# Patient Record
Sex: Female | Born: 1940 | Race: White | Hispanic: No
Health system: Southern US, Community
[De-identification: ages and names within clinical notes are randomized; demographics above are authoritative.]

---

## 2013-06-18 ENCOUNTER — Inpatient Hospital Stay
Admission: AD | Admit: 2013-06-18 | Discharge: 2013-07-29 | Disposition: A | Discharge status: Skilled Nursing Facility | Payer: Medicare Other | Source: Ambulatory Visit | Attending: Internal Medicine | Admitting: Internal Medicine

## 2013-06-18 ENCOUNTER — Other Ambulatory Visit (HOSPITAL_COMMUNITY): Payer: Self-pay

## 2013-06-18 DIAGNOSIS — Z930 Tracheostomy status: Secondary | ICD-10-CM

## 2013-06-18 DIAGNOSIS — J90 Pleural effusion, not elsewhere classified: Secondary | ICD-10-CM

## 2013-06-18 DIAGNOSIS — J189 Pneumonia, unspecified organism: Secondary | ICD-10-CM

## 2013-06-18 DIAGNOSIS — J9600 Acute respiratory failure, unspecified whether with hypoxia or hypercapnia: Secondary | ICD-10-CM

## 2013-06-18 LAB — BLOOD GAS, ARTERIAL
Acid-Base Excess: 1.6 mmol/L (ref 0.0–2.0)
Bicarbonate: 26.2 mEq/L — ABNORMAL HIGH (ref 20.0–24.0)
DRAWN BY: 3127115
FIO2: 0.5 %
O2 Saturation: 99.7 %
PCO2 ART: 45.5 mmHg — AB (ref 35.0–45.0)
PEEP: 5 cmH2O
PH ART: 7.378 (ref 7.350–7.450)
PO2 ART: 137 mmHg — AB (ref 80.0–100.0)
Patient temperature: 98.6
RATE: 12 resp/min
TCO2: 27.6 mmol/L (ref 0–100)
VT: 500 mL

## 2013-06-18 LAB — TROPONIN I

## 2013-06-18 LAB — CBC
HEMATOCRIT: 28.9 % — AB (ref 36.0–46.0)
HEMOGLOBIN: 9.4 g/dL — AB (ref 12.0–15.0)
MCH: 28 pg (ref 26.0–34.0)
MCHC: 32.5 g/dL (ref 30.0–36.0)
MCV: 86 fL (ref 78.0–100.0)
Platelets: 391 10*3/uL (ref 150–400)
RBC: 3.36 MIL/uL — ABNORMAL LOW (ref 3.87–5.11)
RDW: 14.3 % (ref 11.5–15.5)
WBC: 13.6 10*3/uL — AB (ref 4.0–10.5)

## 2013-06-18 LAB — BASIC METABOLIC PANEL
BUN: 25 mg/dL — ABNORMAL HIGH (ref 6–23)
CHLORIDE: 100 meq/L (ref 96–112)
CO2: 23 mEq/L (ref 19–32)
Calcium: 8.9 mg/dL (ref 8.4–10.5)
Creatinine, Ser: 1.22 mg/dL — ABNORMAL HIGH (ref 0.50–1.10)
GFR calc non Af Amer: 43 mL/min — ABNORMAL LOW (ref >90)
GFR, EST AFRICAN AMERICAN: 50 mL/min — AB (ref >90)
Glucose, Bld: 153 mg/dL — ABNORMAL HIGH (ref 70–99)
Potassium: 3.4 mEq/L — ABNORMAL LOW (ref 3.7–5.3)
SODIUM: 140 meq/L (ref 137–147)

## 2013-06-18 LAB — HEPATIC FUNCTION PANEL
ALBUMIN: 2.7 g/dL — AB (ref 3.5–5.2)
ALK PHOS: 117 U/L (ref 39–117)
ALT: 28 U/L (ref 0–35)
AST: 24 U/L (ref 0–37)
Bilirubin, Direct: <0.2 mg/dL (ref 0.0–0.3)
TOTAL PROTEIN: 6.4 g/dL (ref 6.0–8.3)

## 2013-06-18 LAB — PRO B NATRIURETIC PEPTIDE: Pro B Natriuretic peptide (BNP): 2925 pg/mL — ABNORMAL HIGH (ref 0–125)

## 2013-06-18 LAB — SEDIMENTATION RATE: SED RATE: 100 mm/h — AB (ref 0–22)

## 2013-06-18 LAB — MAGNESIUM: Magnesium: 1.8 mg/dL (ref 1.5–2.5)

## 2013-06-18 LAB — PROCALCITONIN: Procalcitonin: 0.25 ng/mL

## 2013-06-18 LAB — CK TOTAL AND CKMB (NOT AT ARMC): Total CK: 50 U/L (ref 7–177)

## 2013-06-19 LAB — VANCOMYCIN, TROUGH: Vancomycin Tr: 7.8 ug/mL — ABNORMAL LOW (ref 10.0–20.0)

## 2013-06-19 LAB — TSH: TSH: 1.066 u[IU]/mL (ref 0.350–4.500)

## 2013-06-19 LAB — T4, FREE: Free T4: 1.14 ng/dL (ref 0.80–1.80)

## 2013-06-19 LAB — C-REACTIVE PROTEIN: CRP: 2.7 mg/dL — AB (ref <0.60)

## 2013-06-21 ENCOUNTER — Other Ambulatory Visit (HOSPITAL_COMMUNITY): Payer: Self-pay

## 2013-06-21 DIAGNOSIS — J9600 Acute respiratory failure, unspecified whether with hypoxia or hypercapnia: Secondary | ICD-10-CM

## 2013-06-21 DIAGNOSIS — J189 Pneumonia, unspecified organism: Secondary | ICD-10-CM

## 2013-06-21 LAB — BASIC METABOLIC PANEL
BUN: 25 mg/dL — ABNORMAL HIGH (ref 6–23)
CHLORIDE: 101 meq/L (ref 96–112)
CO2: 25 mEq/L (ref 19–32)
Calcium: 9 mg/dL (ref 8.4–10.5)
Creatinine, Ser: 1.07 mg/dL (ref 0.50–1.10)
GFR, EST AFRICAN AMERICAN: 59 mL/min — AB (ref >90)
GFR, EST NON AFRICAN AMERICAN: 51 mL/min — AB (ref >90)
Glucose, Bld: 152 mg/dL — ABNORMAL HIGH (ref 70–99)
POTASSIUM: 4 meq/L (ref 3.7–5.3)
SODIUM: 141 meq/L (ref 137–147)

## 2013-06-21 LAB — BLOOD GAS, ARTERIAL
Acid-Base Excess: 0.4 mmol/L (ref 0.0–2.0)
Bicarbonate: 25.2 mEq/L — ABNORMAL HIGH (ref 20.0–24.0)
FIO2: 0.3 %
O2 SAT: 97.2 %
PEEP: 5 cmH2O
PO2 ART: 96.2 mmHg (ref 80.0–100.0)
Patient temperature: 98.6
RATE: 12 resp/min
TCO2: 26.5 mmol/L (ref 0–100)
VT: 500 mL
pCO2 arterial: 45.3 mmHg — ABNORMAL HIGH (ref 35.0–45.0)
pH, Arterial: 7.363 (ref 7.350–7.450)

## 2013-06-21 LAB — CLOSTRIDIUM DIFFICILE BY PCR: Toxigenic C. Difficile by PCR: NEGATIVE

## 2013-06-21 LAB — CBC
HCT: 28.8 % — ABNORMAL LOW (ref 36.0–46.0)
HEMOGLOBIN: 9.3 g/dL — AB (ref 12.0–15.0)
MCH: 28.2 pg (ref 26.0–34.0)
MCHC: 32.3 g/dL (ref 30.0–36.0)
MCV: 87.3 fL (ref 78.0–100.0)
Platelets: 388 10*3/uL (ref 150–400)
RBC: 3.3 MIL/uL — AB (ref 3.87–5.11)
RDW: 14.3 % (ref 11.5–15.5)
WBC: 12.6 10*3/uL — AB (ref 4.0–10.5)

## 2013-06-21 LAB — PRO B NATRIURETIC PEPTIDE: Pro B Natriuretic peptide (BNP): 3146 pg/mL — ABNORMAL HIGH (ref 0–125)

## 2013-06-21 NOTE — Progress Notes (Signed)
[facility]                                                                                              Progress note     Patient Demographics  Deborah Graves, is a 73 y.o. female  ZOX:[id number]  WJX:[id number]  DOB - 1940/09/11  Admit date - 06/18/2013  Admitting Physician Elnora MorrisonAhmad B Barakat, MD  Outpatient Primary MD for the patient is No primary provider on file.  LOS - 3   Chief complaint   Respiratory failure   Anxiety         Subjective:   Unable to obtain due to sedation, nausea and vomiting he reported that night subcutaneous feed was held  Objective:   Vital signs  Temperature 97.4 Heart rate 85 Respiratory rate 16 Blood pressure 158/78 Pulse ox 97%    Exam Sedated, No new F.N deficits, agitated at times [place].AT,  moist mucous membranes, ET tube in place Supple Neck,No JVD, No cervical lymphadenopathy appriciated.  Symmetrical Chest wall movement, diminished with scattered rhonchi and wheezing RRR,No Gallops,Rubs or new Murmurs, No Parasternal Heave +ve B.Sounds, Abd Soft, Non tender, No organomegaly appriciated, No rebound - guarding or rigidity. No Cyanosis, Clubbing with +1 edema bilaterally   I&Os 1260/2100 ET tube  Data Review   CBC  Recent Labs Lab 06/18/13 1559 06/21/13 0530  WBC 13.6* 12.6*  HGB 9.4* 9.3*  HCT 28.9* 28.8*  PLT 391 388  MCV 86.0 87.3  MCH 28.0 28.2  MCHC 32.5 32.3  RDW 14.3 14.3    Chemistries   Recent Labs Lab 06/18/13 1559 06/21/13 0530  NA 140 141  K 3.4* 4.0  CL 100 101  CO2 23 25  GLUCOSE 153* 152*  BUN 25* 25*  CREATININE 1.22* 1.07  CALCIUM 8.9 9.0  MG 1.8  --   AST 24  --   ALT 28  --   ALKPHOS 117  --   BILITOT <0.2*  --      Cardiac Enzymes  Recent Labs Lab 06/18/13 1601  CKMB <1.0  TROPONINI <0.30    ------------------------------------------------------------------------------------------------------------------ No components found with this basename: POCBNP,   Micro Results Recent Results (from the past 240 hour(s))  CULTURE, RESPIRATORY (NON-EXPECTORATED)     Status: None   Collection Time    06/18/13  5:25 PM      Result Value Ref Range Status   Specimen Description TRACHEAL ASPIRATE   Final   Special Requests Normal   Final   Gram Stain     Final   Value: MODERATE WBC PRESENT,BOTH PMN AND MONONUCLEAR     NO SQUAMOUS EPITHELIAL CELLS SEEN     NO ORGANISMS SEEN     Performed at [facility]   Culture     Final   Value: FEW CANDIDA ALBICANS     Performed at [facility]   Report Status PENDING   Incomplete  CULTURE, BLOOD (ROUTINE X 2)     Status: None   Collection Time    06/18/13  6:05 PM      Result Value Ref Range Status  Specimen Description BLOOD RIGHT HAND   Final   Special Requests BOTTLES DRAWN AEROBIC ONLY 4CC   Final   Culture  Setup Time     Final   Value: 06/18/2013 23:55     Performed at [facility]   Culture     Final   Value:        BLOOD CULTURE RECEIVED NO GROWTH TO DATE CULTURE WILL BE HELD FOR 5 DAYS BEFORE ISSUING A FINAL NEGATIVE REPORT     Performed at [facility]   Report Status PENDING   Incomplete  CULTURE, BLOOD (ROUTINE X 2)     Status: None   Collection Time    06/18/13  6:13 PM      Result Value Ref Range Status   Specimen Description BLOOD LEFT HAND   Final   Special Requests     Final   Value: BOTTLES DRAWN AEROBIC AND ANAEROBIC 10CC AER 8CC ANA   Culture  Setup Time     Final   Value: 06/18/2013 23:55     Performed at [facility]   Culture     Final   Value:        BLOOD CULTURE RECEIVED NO GROWTH TO DATE CULTURE WILL BE HELD FOR 5 DAYS BEFORE ISSUING A FINAL NEGATIVE REPORT     Performed at [facility]   Report Status PENDING   Incomplete       Assessment & Plan     VDRF , ET tube noted still failing weaning trials, PCM consult Line sepsis/SIRS with staph hominis cultured in transferring hospital History of bilateral pneumonia; continue with vancomycin and cefepime Mild non-ST elevation MI at transferring hospital, continue with statin COPD continue with maps Upper GI bleed with gastroccult positive in transferring hospital off aspirin for now Congestive heart failure; mixed systolic and diastolic with ejection fraction 38 percent; continue with diuresis Diabetes mellitus type 2 continue Levemir and insulin sliding scale CVA continue with statin off aspirin due to GI bleed Hypothyroidism normal studies Anxiety disorder on high doses off Versed and fentanyl Noncompliance Protein calorie malnutrition on Glucerna 1.5 per tube, on hold now due to nausea and vomiting  Plan Check KUB today for tracheostomy early this week Discussed with pulmonary critical care Critical care time 38 minutes  Code Status: Full    DVT Prophylaxis SCDs   Carron Curie M.D on 06/21/2013 at 10:25 AM

## 2013-06-21 NOTE — Consult Note (Addendum)
   Name: Deborah CowerMartha Lyssy MRN: [id number] DOB: 1940-03-26    ADMISSION DATE:  06/18/2013 CONSULTATION DATE:  3/30  REFERRING MD :  Sharyon MedicusHijazi  [facility]   CHIEF COMPLAINT:  Vent weaning  BRIEF PATIENT DESCRIPTION:    SIGNIFICANT EVENTS / STUDIES:  ECHO at outside hospital: EF 38%  LINES / TUBES: OETT 3/10>>3/19>>>  CULTURES: Staph hominas 3/10   ANTIBIOTICS:  HISTORY OF PRESENT ILLNESS:   This is a 73 year old female who was [place]/ acute hypercarbic respiratory failure (PCO2 >100), encephalopathy, hyperglycemia and SIRS/sepsis w/ working dx of AECOPD +/- PNA and staph hominas bacteremia. Was intubated, admitted to ICU. Course complicated by NSTEMI, w/ decompensated CM (EF 38%),  presumed HCAP, failed extubation, GIB and concern for CVA due to left sided hemiparesis. [facility].  PAST MEDICAL HISTORY :   HTN DM Medical non-compliance   Allergies not on file  FAMILY HISTORY:  No family history on file. SOCIAL HISTORY:  has no tobacco, alcohol, and drug history on file.  REVIEW OF SYSTEMS:   unable    SUBJECTIVE:  Sedated on vent  VITAL SIGNS:   reviewed   PHYSICAL EXAMINATION: General:  Chronically ill appearing white female, currently sedated on vent  Neuro:  Sedated left side weak HEENT:  Orally intubated  Cardiovascular:  rrr Lungs:  Scattered rhonchi  Abdomen:  Soft, obese, positive bowel sounds  Musculoskeletal:  Generalized edema  Skin:  Scattered area of ecchymosis    Recent Labs Lab 06/18/13 1559 06/21/13 0530  NA 140 141  K 3.4* 4.0  CL 100 101  CO2 23 25  BUN 25* 25*  CREATININE 1.22* 1.07  GLUCOSE 153* 152*    Recent Labs Lab 06/18/13 1559 06/21/13 0530  HGB 9.4* 9.3*  HCT 28.9* 28.8*  WBC 13.6* 12.6*  PLT 391 388   No results found.  ASSESSMENT / PLAN: Acute respiratory failure/ failure to wean (multifactorial due to below):    Decompensated systolic heart failure w/ pulmonary edema  Staph bacteremia and sepsis  HCAP (NOS)  Probable COPD  NSTEMI  Acute encephalopathy   Possible CVA Recommendations: -needs trach -after trach d/c sedating gtts, transition to PRN  -cont current card meds  -continue diuresis as BP/BUN and creatinine allow -continue cefepime: 3/28-->4/4 -cont vanc 3/29-->4/5 -f/u repeat blood cultures. If this is truly bacteremia might need to extend abx. As this was present on admission labs not clear if this is contaminate or actual pathogen. Might consider one time ID input.  -family would not want prolonged care. If after 3-4 weeks of weaning after trach, if no progress would want more palliative approach.   Additional issues: DM, hypothyroidism, heme positive stools Plan Defer to primary team.   CC time 40 min.  Patient seen and examined, agree with above note.  I dictated the care and orders written for this patient under my direction.  Alyson ReedyWesam G Yacoub, MD [phone]

## 2013-06-22 ENCOUNTER — Other Ambulatory Visit (HOSPITAL_COMMUNITY): Payer: Self-pay

## 2013-06-22 ENCOUNTER — Encounter (HOSPITAL_COMMUNITY): Payer: Medicare Other

## 2013-06-22 LAB — BASIC METABOLIC PANEL
BUN: 26 mg/dL — AB (ref 6–23)
CHLORIDE: 103 meq/L (ref 96–112)
CO2: 25 mEq/L (ref 19–32)
Calcium: 8.8 mg/dL (ref 8.4–10.5)
Creatinine, Ser: 1.5 mg/dL — ABNORMAL HIGH (ref 0.50–1.10)
GFR calc Af Amer: 39 mL/min — ABNORMAL LOW (ref >90)
GFR, EST NON AFRICAN AMERICAN: 34 mL/min — AB (ref >90)
GLUCOSE: 169 mg/dL — AB (ref 70–99)
Potassium: 3.7 mEq/L (ref 3.7–5.3)
Sodium: 143 mEq/L (ref 137–147)

## 2013-06-22 LAB — CBC
HEMATOCRIT: 26.6 % — AB (ref 36.0–46.0)
HEMOGLOBIN: 8.4 g/dL — AB (ref 12.0–15.0)
MCH: 27.8 pg (ref 26.0–34.0)
MCHC: 31.6 g/dL (ref 30.0–36.0)
MCV: 88.1 fL (ref 78.0–100.0)
Platelets: 372 10*3/uL (ref 150–400)
RBC: 3.02 MIL/uL — ABNORMAL LOW (ref 3.87–5.11)
RDW: 14.4 % (ref 11.5–15.5)
WBC: 11 10*3/uL — ABNORMAL HIGH (ref 4.0–10.5)

## 2013-06-22 LAB — CULTURE, RESPIRATORY W GRAM STAIN

## 2013-06-22 LAB — CULTURE, RESPIRATORY: Special Requests: NORMAL

## 2013-06-22 NOTE — Progress Notes (Addendum)
[facility]                                                                                              Progress note     Patient Demographics  Deborah Graves, is a 73 y.o. female  ZOX:[id number]  WJX:[id number]  DOB - 22-Mar-1941  Admit date - 06/18/2013  Admitting Physician Elnora Morrison, MD  Outpatient Primary MD for the patient is No primary provider on file.  LOS - 4   Chief complaint   Respiratory failure   Anxiety         Subjective:   Unable to obtain due to sedation, nausea and vomiting he reported that night subcutaneous feed was held  Objective:   Vital signs  Temperature 98.7 Heart rate 62 Respiratory rate 12 Blood pressure 97/76 Pulse ox 97%    Exam Sedated, No new F.N deficits, agitated at times [place].AT,  moist mucous membranes, ET tube in place Supple Neck,No JVD, No cervical lymphadenopathy appriciated.  Symmetrical Chest wall movement, diminished with scattered rhonchi and wheezing RRR,No Gallops,Rubs or new Murmurs, No Parasternal Heave +ve B.Sounds, Abd Soft, Non tender, No organomegaly appriciated, No rebound - guarding or rigidity. No Cyanosis, Clubbing with +1 edema bilaterally   I&Os 712/100 ET tube  Data Review   CBC  Recent Labs Lab 06/18/13 1559 06/21/13 0530 06/22/13 0500  WBC 13.6* 12.6* 11.0*  HGB 9.4* 9.3* 8.4*  HCT 28.9* 28.8* 26.6*  PLT 391 388 372  MCV 86.0 87.3 88.1  MCH 28.0 28.2 27.8  MCHC 32.5 32.3 31.6  RDW 14.3 14.3 14.4    Chemistries   Recent Labs Lab 06/18/13 1559 06/21/13 0530 06/22/13 0500  NA 140 141 143  K 3.4* 4.0 3.7  CL 100 101 103  CO2 23 25 25   GLUCOSE 153* 152* 169*  BUN 25* 25* 26*  CREATININE 1.22* 1.07 1.50*  CALCIUM 8.9 9.0 8.8  MG 1.8  --   --   AST 24  --   --   ALT 28  --   --   ALKPHOS 117  --   --   BILITOT <0.2*  --   --      Cardiac Enzymes  Recent Labs Lab 06/18/13 1601   CKMB <1.0  TROPONINI <0.30   ------------------------------------------------------------------------------------------------------------------ No components found with this basename: POCBNP,   Micro Results Recent Results (from the past 240 hour(s))  CULTURE, RESPIRATORY (NON-EXPECTORATED)     Status: None   Collection Time    06/18/13  5:25 PM      Result Value Ref Range Status   Specimen Description TRACHEAL ASPIRATE   Final   Special Requests Normal   Final   Gram Stain     Final   Value: MODERATE WBC PRESENT,BOTH PMN AND MONONUCLEAR     NO SQUAMOUS EPITHELIAL CELLS SEEN     NO ORGANISMS SEEN     Performed at [facility]   Culture     Final   Value: FEW CANDIDA ALBICANS     Performed at [facility]   Report Status PENDING  Incomplete  CULTURE, BLOOD (ROUTINE X 2)     Status: None   Collection Time    06/18/13  6:05 PM      Result Value Ref Range Status   Specimen Description BLOOD RIGHT HAND   Final   Special Requests BOTTLES DRAWN AEROBIC ONLY 4CC   Final   Culture  Setup Time     Final   Value: 06/18/2013 23:55     Performed at [facility]   Culture     Final   Value:        BLOOD CULTURE RECEIVED NO GROWTH TO DATE CULTURE WILL BE HELD FOR 5 DAYS BEFORE ISSUING A FINAL NEGATIVE REPORT     Performed at [facility]   Report Status PENDING   Incomplete  CULTURE, BLOOD (ROUTINE X 2)     Status: None   Collection Time    06/18/13  6:13 PM      Result Value Ref Range Status   Specimen Description BLOOD LEFT HAND   Final   Special Requests     Final   Value: BOTTLES DRAWN AEROBIC AND ANAEROBIC 10CC AER 8CC ANA   Culture  Setup Time     Final   Value: 06/18/2013 23:55     Performed at [facility]   Culture     Final   Value:        BLOOD CULTURE RECEIVED NO GROWTH TO DATE CULTURE WILL BE HELD FOR 5 DAYS BEFORE ISSUING A FINAL NEGATIVE REPORT     Performed at [facility]   Report Status PENDING   Incomplete   CLOSTRIDIUM DIFFICILE BY PCR     Status: None   Collection Time    06/21/13 10:38 AM      Result Value Ref Range Status   C difficile by pcr NEGATIVE  NEGATIVE Final       Assessment & Plan    VDRF , ET tube noted still failing weaning trials, today patient looks like fighting the ventilator , take will be done by ENT Line sepsis/SIRS with staph hominis cultured in transferring hospital History of bilateral pneumonia; continue with vancomycin and cefepime Mild non-ST elevation MI at transferring hospital, continue with statin COPD continue with nebulizer treatment Upper GI bleed with gastroccult positive in transferring hospital off aspirin for now Congestive heart failure; mixed systolic and diastolic with ejection fraction 38 percent; continue with diuresis Diabetes mellitus type 2 continue Levemir and insulin sliding scale CVA continue with statin off aspirin due to GI bleed Hypothyroidism normal studies Anxiety disorder on high doses off Versed and fentanyl Noncompliance Protein calorie malnutrition on Glucerna 1.5 per tube, started on low dose again  Plan Change the settings of mechanical ventilation to TV 450 rate 18 FiO2 40% and PEEP of 5 Check labs in a.m. Restart tube feeding slowly Critical-care time 41 minutes  Code Status: Full    DVT Prophylaxis SCDs   Carron CurieHijazi, Jemmie Rhinehart M.D on 06/22/2013 at 11:42 AM

## 2013-06-23 ENCOUNTER — Other Ambulatory Visit (HOSPITAL_COMMUNITY): Payer: Self-pay

## 2013-06-23 NOTE — Progress Notes (Signed)
PULMONARY / CRITICAL CARE MEDICINE   Name: Deborah Graves MRN: [id number] DOB: [birth year]    CONSULTATION DATE:  06/21/2013   CHIEF COMPLAINT:  Acute respiratory failure  BRIEF PATIENT DESCRIPTION:  73 yo female admitted to outside hospital with COPD, pneumonia, sepsis, Staph hominas bacteremia and hypercarbic respiratory failure requiring intubation.  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   SUBJECTIVE: Assessed for tracheostomy - not a candidate for percutaneous procedure due to anatomy  PHYSICAL EXAMINATION: General:  Chronically ill appearing white female, currently sedated on vent  Neuro:  Sedated left side weak HEENT:  Orally intubated  Cardiovascular:  rrr Lungs:  Scattered rhonchi  Abdomen:  Soft, obese, positive bowel sounds  Musculoskeletal:  Generalized edema  Skin:  Scattered area of ecchymosis   LABS:  Recent Labs Lab 06/18/13 1559 06/21/13 0530 06/22/13 0500  NA 140 141 143  K 3.4* 4.0 3.7  CL 100 101 103  CO2 23 25 25   BUN 25* 25* 26*  CREATININE 1.22* 1.07 1.50*  GLUCOSE 153* 152* 169*    Recent Labs Lab 06/18/13 1559 06/21/13 0530 06/22/13 0500  HGB 9.4* 9.3* 8.4*  HCT 28.9* 28.8* 26.6*  WBC 13.6* 12.6* 11.0*  PLT 391 388 372   IMAGING: Dg Chest Port 1 View  06/23/2013   CLINICAL DATA:  Respiratory failure.  EXAM: PORTABLE CHEST - 1 VIEW  COMPARISON:  DG CHEST 1V PORT dated 06/22/2013  FINDINGS: Endotracheal tube and NG tube in stable position. Progressive dense right lower lobe infiltrate with possible right pleural effusion noted. Atelectasis and/or infiltrate left lower lobe with small left pleural effusion cannot be excluded. Cardiomegaly is present. No pneumothorax.  IMPRESSION: 1. Line and tube positions stable. 2. Progressive right lower lobe infiltrate consistent with pneumonia. Right-sided pleural effusion cannot be excluded. 3. Left  lower lobe atelectasis and/or infiltrate also present. 4. Cardiomegaly. A component of congestive heart failure/pulmonary edema may be present.   Electronically Signed   By: Maisie Fushomas  Register   On: 06/23/2013 07:07   Dg Chest Port 1 View  06/22/2013   CLINICAL DATA:  Respiratory failure.  EXAM: PORTABLE CHEST - 1 VIEW  COMPARISON:  DG CHEST 1V PORT dated 06/18/2013  FINDINGS: Endotracheal tube is 2.7 cm above the carina. Nasogastric tube extends into the abdomen. Central line tip in the upper SVC region. Hazy densities at both lung bases suggest atelectasis and pleural effusions. Cannot exclude mild interstitial edema. Heart size is within normal limits. Atherosclerotic calcifications at the aortic arch.  IMPRESSION: Bibasilar densities are compatible with pleural effusions and atelectasis. Probable mild interstitial edema.   Electronically Signed   By: Richarda OverlieAdam  Henn M.D.   On: 06/22/2013 08:00   Dg Abd Portable 1v  06/21/2013   CLINICAL DATA:  Small bowel obstruction.  EXAM: PORTABLE ABDOMEN - 1 VIEW  COMPARISON:  06/18/2013  FINDINGS: There is no bowel dilation. There is no radiographic evidence of a bowel obstruction. A nasogastric tube lies within the mid stomach.  There are surgical vascular clips in the pelvis. Soft tissues are otherwise unremarkable.  IMPRESSION: No evidence of a bowel obstruction.   Electronically Signed   By: Amie Portlandavid  Ormond M.D.   On: 06/21/2013 17:05   ASSESSMENT: Acute respiratory failure Acute on chronic systolic heart failure Pulmonary edema HCAP Suspectd COPD without evidence of exacerbation Possible CVA  Acute encephalopathy / delirium Psychiatric history?  PLAN: Tracheostomy by ENT  D/c Versed gtt after tracheostomy and start Precedex Defer weaning attempts until after tracheostomy and sedation d/c Add Depakote  Bronchodilators Add inhaled steroids No indication for systemic steroids Antibiotics per primary team Aim for negative fluid balance and avoid  tachycardia Family would not want prolonged care, would consider palliation if weaning is unsuccessful   Simonne Martinet, NP [phone]  I have personally obtained history, examined patient, evaluated and interpreted laboratory and imaging results, reviewed medical records, formulated assessment / plan and placed orders.  Lonia Farber, MD Pulmonary and Critical Care Medicine [facility] Pager: [phone]  06/23/2013, 1:14 PM

## 2013-06-23 NOTE — Progress Notes (Signed)
[facility]                                                                                              Progress note     Patient Demographics  Deborah Graves, is a 73 y.o. female  ZOX:[id number]  WJX:[id number]  DOB - 24-Jul-1940  Admit date - 06/18/2013  Admitting Physician Elnora Morrison, MD  Outpatient Primary MD for the patient is No primary provider on file.  LOS - 5   Chief complaint   Respiratory failure   Anxiety         Subjective:   Unable to obtain due to sedation, nausea and vomiting he reported that night subcutaneous feed was held  Objective:   Vital signs  Temperature 97.5 Heart rate 60 Respiratory rate 18 Blood pressure 144/55 Pulse ox 99%    Exam Sedated, No new F.N deficits, agitated at times [place].AT,  moist mucous membranes, ET tube in place Supple Neck,No JVD, No cervical lymphadenopathy appriciated.  Symmetrical Chest wall movement, diminished with scattered rhonchi and wheezing RRR,No Gallops,Rubs or new Murmurs, No Parasternal Heave +ve B.Sounds, Abd Soft, Non tender, No organomegaly appriciated, No rebound - guarding or rigidity. No Cyanosis, Clubbing with +1 edema bilaterally   I&Os 2434/1000 ET tube  Data Review   CBC  Recent Labs Lab 06/18/13 1559 06/21/13 0530 06/22/13 0500  WBC 13.6* 12.6* 11.0*  HGB 9.4* 9.3* 8.4*  HCT 28.9* 28.8* 26.6*  PLT 391 388 372  MCV 86.0 87.3 88.1  MCH 28.0 28.2 27.8  MCHC 32.5 32.3 31.6  RDW 14.3 14.3 14.4    Chemistries   Recent Labs Lab 06/18/13 1559 06/21/13 0530 06/22/13 0500  NA 140 141 143  K 3.4* 4.0 3.7  CL 100 101 103  CO2 23 25 25   GLUCOSE 153* 152* 169*  BUN 25* 25* 26*  CREATININE 1.22* 1.07 1.50*  CALCIUM 8.9 9.0 8.8  MG 1.8  --   --   AST 24  --   --   ALT 28  --   --   ALKPHOS 117  --   --   BILITOT <0.2*  --   --      Cardiac Enzymes  Recent Labs Lab  06/18/13 1601  CKMB <1.0  TROPONINI <0.30   ------------------------------------------------------------------------------------------------------------------ No components found with this basename: POCBNP,   Micro Results Recent Results (from the past 240 hour(s))  CULTURE, RESPIRATORY (NON-EXPECTORATED)     Status: None   Collection Time    06/18/13  5:25 PM      Result Value Ref Range Status   Specimen Description TRACHEAL ASPIRATE   Final   Special Requests Normal   Final   Gram Stain     Final   Value: MODERATE WBC PRESENT,BOTH PMN AND MONONUCLEAR     NO SQUAMOUS EPITHELIAL CELLS SEEN     NO ORGANISMS SEEN     Performed at [facility]   Culture     Final   Value: FEW CANDIDA ALBICANS     Performed at [facility]   Report Status 06/22/2013 FINAL  Final  CULTURE, BLOOD (ROUTINE X 2)     Status: None   Collection Time    06/18/13  6:05 PM      Result Value Ref Range Status   Specimen Description BLOOD RIGHT HAND   Final   Special Requests BOTTLES DRAWN AEROBIC ONLY 4CC   Final   Culture  Setup Time     Final   Value: 06/18/2013 23:55     Performed at [facility]   Culture     Final   Value:        BLOOD CULTURE RECEIVED NO GROWTH TO DATE CULTURE WILL BE HELD FOR 5 DAYS BEFORE ISSUING A FINAL NEGATIVE REPORT     Performed at [facility]   Report Status PENDING   Incomplete  CULTURE, BLOOD (ROUTINE X 2)     Status: None   Collection Time    06/18/13  6:13 PM      Result Value Ref Range Status   Specimen Description BLOOD LEFT HAND   Final   Special Requests     Final   Value: BOTTLES DRAWN AEROBIC AND ANAEROBIC 10CC AER 8CC ANA   Culture  Setup Time     Final   Value: 06/18/2013 23:55     Performed at [facility]   Culture     Final   Value:        BLOOD CULTURE RECEIVED NO GROWTH TO DATE CULTURE WILL BE HELD FOR 5 DAYS BEFORE ISSUING A FINAL NEGATIVE REPORT     Performed at [facility]   Report Status PENDING    Incomplete  CLOSTRIDIUM DIFFICILE BY PCR     Status: None   Collection Time    06/21/13 10:38 AM      Result Value Ref Range Status   C difficile by pcr NEGATIVE  NEGATIVE Final       Assessment & Plan    VDRF , ET tube noted still failing weaning trials,  trach will be done by ENT on Friday a.m. Line sepsis/SIRS with staph hominis cultured in transferring hospital History of bilateral pneumonia; continue with vancomycin and cefepime Mild non-ST elevation MI at transferring hospital, continue with statin COPD continue with nebulizer treatment Upper GI bleed with gastroccult positive in transferring hospital off aspirin for now Congestive heart failure; mixed systolic and diastolic with ejection fraction 38 percent; continue with diuresis and    ncrease Lasix to twice a day Diabetes mellitus type 2 continue Levemir and insulin sliding scale CVA continue with statin off aspirin due to GI bleed Hypothyroidism normal studies Anxiety disorder on high doses off Versed and fentanyl will start weaning and start Depakote or Noncompliance Protein calorie malnutrition on Glucerna 1.5 per tube, started on low dose again  Plan Increase Lasix to 40 mg IV twice a day  hold Lovenox on Thursday  Depakote 250 twice a day Change nebulizer treatments to every 6 hours   Pulmicort twice a day DC IV fluids Discussed with PCCM Critical care time 33 minutes   Code Status: Full    DVT Prophylaxis  Lovenox   Carron CurieHijazi, Fable Huisman M.D on 06/23/2013 at 12:33 PM

## 2013-06-24 ENCOUNTER — Other Ambulatory Visit (HOSPITAL_COMMUNITY): Payer: Self-pay

## 2013-06-24 ENCOUNTER — Encounter (HOSPITAL_COMMUNITY): Payer: Self-pay

## 2013-06-24 LAB — CBC
HEMATOCRIT: 24.8 % — AB (ref 36.0–46.0)
Hemoglobin: 7.7 g/dL — ABNORMAL LOW (ref 12.0–15.0)
MCH: 27.4 pg (ref 26.0–34.0)
MCHC: 31 g/dL (ref 30.0–36.0)
MCV: 88.3 fL (ref 78.0–100.0)
Platelets: 360 10*3/uL (ref 150–400)
RBC: 2.81 MIL/uL — ABNORMAL LOW (ref 3.87–5.11)
RDW: 14.8 % (ref 11.5–15.5)
WBC: 9.6 10*3/uL (ref 4.0–10.5)

## 2013-06-24 LAB — CULTURE, BLOOD (ROUTINE X 2)
CULTURE: NO GROWTH
Culture: NO GROWTH

## 2013-06-24 LAB — BASIC METABOLIC PANEL
BUN: 37 mg/dL — AB (ref 6–23)
CO2: 24 mEq/L (ref 19–32)
CREATININE: 2.82 mg/dL — AB (ref 0.50–1.10)
Calcium: 8.8 mg/dL (ref 8.4–10.5)
Chloride: 102 mEq/L (ref 96–112)
GFR calc Af Amer: 18 mL/min — ABNORMAL LOW (ref >90)
GFR, EST NON AFRICAN AMERICAN: 16 mL/min — AB (ref >90)
Glucose, Bld: 108 mg/dL — ABNORMAL HIGH (ref 70–99)
Potassium: 4.9 mEq/L (ref 3.7–5.3)
Sodium: 139 mEq/L (ref 137–147)

## 2013-06-24 NOTE — Progress Notes (Addendum)
[facility]                                                                                              Progress note     Patient Demographics  Deborah Graves, is a [age] y.o. female  UVO:[id number]  [id number]:[id number]  DOB - 09-Aug-1940  Admit date - 06/18/2013  Admitting Physician Elnora MorrisonAhmad B Barakat, MD  Outpatient Primary MD for the patient is No primary provider on file.  LOS - 6   Chief complaint   Respiratory failure   Anxiety         Subjective:   Unable to obtain due to sedation, nausea and vomiting he reported that night subcutaneous feed was held  Objective:   Vital signs  Temperature  98.5  Heart rate 74  Respiratory rate  17   Blood pressure  99/79 Pulse ox  100%     Exam Sedated, No new F.N deficits, agitated at times [place].AT,  moist mucous membranes, ET tube in place Supple Neck,No JVD, No cervical lymphadenopathy appriciated.  Symmetrical Chest wall movement, diminished with scattered rhonchi and wheezing RRR,No Gallops,Rubs or new Murmurs, No Parasternal Heave +ve B.Sounds, Abd Soft, Non tender, No organomegaly appriciated, No rebound - guarding or rigidity. No Cyanosis, Clubbing with +1 edema bilaterally   I&Os  1766/825  ET tube  Data Review   CBC  Recent Labs Lab 06/18/13 1559 06/21/13 0530 06/22/13 0500 06/24/13 0615  WBC 13.6* 12.6* 11.0* 9.6  HGB 9.4* 9.3* 8.4* 7.7*  HCT 28.9* 28.8* 26.6* 24.8*  PLT 391 388 372 360  MCV 86.0 87.3 88.1 88.3  MCH 28.0 28.2 27.8 27.4  MCHC 32.5 32.3 31.6 31.0  RDW 14.3 14.3 14.4 14.8    Chemistries   Recent Labs Lab 06/18/13 1559 06/21/13 0530 06/22/13 0500 06/24/13 0615  NA 140 141 143 139  K 3.4* 4.0 3.7 4.9  CL 100 101 103 102  CO2 23 25 25 24   GLUCOSE 153* 152* 169* 108*  BUN 25* 25* 26* 37*  CREATININE 1.22* 1.07 1.50* 2.82*  CALCIUM 8.9 9.0 8.8 8.8  MG 1.8  --   --   --   AST 24  --   --   --    ALT 28  --   --   --   ALKPHOS 117  --   --   --   BILITOT <0.2*  --   --   --      Cardiac Enzymes  Recent Labs Lab 06/18/13 1601  CKMB <1.0  TROPONINI <0.30   ------------------------------------------------------------------------------------------------------------------ No components found with this basename: POCBNP,   Micro Results Recent Results (from the past 240 hour(s))  CULTURE, RESPIRATORY (NON-EXPECTORATED)     Status: None   Collection Time    06/18/13  5:25 PM      Result Value Ref Range Status   Specimen Description TRACHEAL ASPIRATE   Final   Special Requests Normal   Final   Gram Stain     Final   Value: MODERATE WBC PRESENT,BOTH PMN AND MONONUCLEAR     NO SQUAMOUS EPITHELIAL CELLS  SEEN     NO ORGANISMS SEEN     Performed at [facility]   Culture     Final   Value: FEW CANDIDA ALBICANS     Performed at [facility]   Report Status 06/22/2013 FINAL   Final  CULTURE, BLOOD (ROUTINE X 2)     Status: None   Collection Time    06/18/13  6:05 PM      Result Value Ref Range Status   Specimen Description BLOOD RIGHT HAND   Final   Special Requests BOTTLES DRAWN AEROBIC ONLY 4CC   Final   Culture  Setup Time     Final   Value: 06/18/2013 23:55     Performed at [facility]   Culture     Final   Value: NO GROWTH 5 DAYS     Performed at [facility]   Report Status 06/24/2013 FINAL   Final  CULTURE, BLOOD (ROUTINE X 2)     Status: None   Collection Time    06/18/13  6:13 PM      Result Value Ref Range Status   Specimen Description BLOOD LEFT HAND   Final   Special Requests     Final   Value: BOTTLES DRAWN AEROBIC AND ANAEROBIC 10CC AER 8CC ANA   Culture  Setup Time     Final   Value: 06/18/2013 23:55     Performed at [facility]   Culture     Final   Value: NO GROWTH 5 DAYS     Performed at [facility]   Report Status 06/24/2013 FINAL   Final  CLOSTRIDIUM DIFFICILE BY PCR     Status: None    Collection Time    06/21/13 10:38 AM      Result Value Ref Range Status   C difficile by pcr NEGATIVE  NEGATIVE Final       Assessment & Plan    VDRF , ET tube noted still failing weaning trials,  trach will be done by ENT on Friday a.m., Keep n.p.o.> midnight Line sepsis/SIRS with staph hominis cultured in transferring hospital History of bilateral pneumonia; continue with vancomycin and cefepime Mild non-ST elevation MI at transferring hospital, continue with statin COPD continue with nebulizer treatment Upper GI bleed with gastroccult positive in transferring hospital off aspirin for now Congestive heart failure; mixed systolic and diastolic with ejection fraction 38 percent; hold diuresis due to renal failure Diabetes mellitus type 2 continue Levemir and insulin sliding scale CVA continue with statin off aspirin due to GI bleed Hypothyroidism normal studies Anxiety disorder on high doses off Versed and fentanyl will start weaning and start Depakote or Noncompliance Protein calorie malnutrition on Glucerna 1.5 per tube, started on low dose again Acute on chronic kidney injury  Plan Hold Lasix today and tomorrow  IV fluids normal saline DC lisinopril Check BMP in a.m. Change DVT prophylaxis to heparin  Critical care time 36 minutes  Code Status: Full  DVT Prophylaxis  heparin   Carron Curie M.D on 06/24/2013 at 12:04 PM

## 2013-06-24 NOTE — Consult Note (Unsigned)
NAMBarbara Graves:  Graves, Deborah             ACCOUNT NO.:  [id number]  MEDICAL RECORD NO.:  [id number]  LOCATION:  RESPT                        FACILITY:  [facility]  PHYSICIAN:  Kristine GarbeChristopher E. Ezzard StandingNewman, M.D.DATE OF BIRTH:  [birth year]  DATE OF CONSULTATION: DATE OF DISCHARGE:                                CONSULTATION   REASON FOR CONSULT:  Evaluate patient for tracheotomy.  BRIEF HISTORY:  Deborah CowerMartha Graves is a 73 year old female with history of poorly controlled diabetes and hypertension.  She was [place] shortness of breath, breath and change in mental status.  She was intubated at that time. Attempted extubation was unsuccessful on BiPAP and she was reintubated last on June 10, 2013.  She had EEG, which showed disturbance of cerebral function consistent with severe encephalopathy.  She had a CT scan, which demonstrated no obvious intracranial abnormalities.  She was subsequently [facility].  She was not felt to be a candidate for bedside tracheotomy and I was subsequently consulted for placement of tracheostomy via the operating room.  She has been chronically intubated, has failed extubation.  PHYSICAL EXAMINATION:  The patient is not mentally responsive.  She is mildly obese.  Neck is little crooked, but no palpable masses along the trach site.  The patient is intubated on ventilator.  IMPRESSION:  Acute-on-chronic respiratory failure.  RECOMMENDATION:  We will plan tracheotomy in the next couple of days. The patient is presently on Lovenox.  We will need to stop the Lovenox prior to trach and also hold the tube feedings.  We will plan tracheotomy on June 25, 2013.          ______________________________ Kristine Garbehristopher E. Ezzard StandingNewman, M.D.     CEN/MEDQ  D:  06/23/2013  T:  06/23/2013  Job:  [id number]

## 2013-06-24 NOTE — H&P (Signed)
PREOPERATIVE H&P  Chief Complaint: respiratory failure  HPI: Barbara CowerMartha Graves is a 73 y.o. female who presents for evaluation of respiratory failure. She initially presented to an outside ER with mental status changes and respiratory difficulty requiring intubation 3 weeks ago. Initial attempt at extubation was unsuccessful requiring reintubation. [facility]. Bedside tracheostomy was not felt to be an option and I was consulted for placement of trach in the OR.  No past medical history on file. No past surgical history on file. History   Social History  . Marital Status: Widowed    Spouse Name: N/A    Number of Children: N/A  . Years of Education: N/A   Social History Main Topics  . Smoking status: Not on file  . Smokeless tobacco: Not on file  . Alcohol Use: Not on file  . Drug Use: Not on file  . Sexual Activity: Not on file   Other Topics Concern  . Not on file   Social History Narrative  . No narrative on file   No family history on file. Allergies not on file Prior to Admission medications   Not on File     Positive ROS: patient not responsive  All other systems have been reviewed and were otherwise negative with the exception of those mentioned in the HPI and as above.  Physical Exam: There were no vitals filed for this visit.  General: Intubated and non responsive Oral: Normal oral mucosa and tonsils Nasal: Clear nasal passages Neck: No palpable adenopathy or thyroid nodules. Trach midline without masses. Ear: Ear canal is clear with normal appearing TMs Cardiovascular: Regular rate and rhythm, no murmur.  Respiratory: Clear to auscultation Neurologic: NA   Assessment/Plan: RESPIRATORY FAILURE Plan for Procedure(s): TRACHEOSTOMY   Dillard CannonNEWMAN, CHRISTOPHER, MD 06/24/2013 12:23 PM

## 2013-06-25 ENCOUNTER — Other Ambulatory Visit (HOSPITAL_COMMUNITY): Payer: Self-pay

## 2013-06-25 ENCOUNTER — Ambulatory Visit (HOSPITAL_COMMUNITY): Admission: RE | Admit: 2013-06-25 | Payer: Medicare Other | Source: Ambulatory Visit | Admitting: Otolaryngology

## 2013-06-25 ENCOUNTER — Encounter (HOSPITAL_COMMUNITY): Payer: Self-pay | Admitting: Anesthesiology

## 2013-06-25 ENCOUNTER — Encounter
Admission: AD | Disposition: A | Discharge status: Skilled Nursing Facility | Payer: Self-pay | Source: Ambulatory Visit | Attending: Internal Medicine

## 2013-06-25 HISTORY — PX: TRACHEOSTOMY TUBE PLACEMENT: SHX814

## 2013-06-25 LAB — URINALYSIS, ROUTINE W REFLEX MICROSCOPIC
BILIRUBIN URINE: NEGATIVE
Glucose, UA: NEGATIVE mg/dL
Ketones, ur: NEGATIVE mg/dL
Leukocytes, UA: NEGATIVE
NITRITE: NEGATIVE
Protein, ur: 30 mg/dL — AB
SPECIFIC GRAVITY, URINE: 1.02 (ref 1.005–1.030)
Urobilinogen, UA: 0.2 mg/dL (ref 0.0–1.0)
pH: 5 (ref 5.0–8.0)

## 2013-06-25 LAB — URINE MICROSCOPIC-ADD ON

## 2013-06-25 LAB — BASIC METABOLIC PANEL
BUN: 45 mg/dL — AB (ref 6–23)
CALCIUM: 8.8 mg/dL (ref 8.4–10.5)
CO2: 24 meq/L (ref 19–32)
CREATININE: 3.16 mg/dL — AB (ref 0.50–1.10)
Chloride: 103 mEq/L (ref 96–112)
GFR calc Af Amer: 16 mL/min — ABNORMAL LOW (ref >90)
GFR, EST NON AFRICAN AMERICAN: 14 mL/min — AB (ref >90)
GLUCOSE: 118 mg/dL — AB (ref 70–99)
Potassium: 5.5 mEq/L — ABNORMAL HIGH (ref 3.7–5.3)
Sodium: 140 mEq/L (ref 137–147)

## 2013-06-25 LAB — VANCOMYCIN, RANDOM: VANCOMYCIN RM: 41.9 ug/mL

## 2013-06-25 SURGERY — CREATION, TRACHEOSTOMY
Anesthesia: General | Site: Neck

## 2013-06-25 MED ORDER — PROPOFOL 10 MG/ML IV BOLUS
INTRAVENOUS | Status: AC
  Filled 2013-06-25: qty 20

## 2013-06-25 MED ORDER — 0.9 % SODIUM CHLORIDE (POUR BTL) OPTIME
TOPICAL | Status: DC | PRN
  Administered 2013-06-25: 1000 mL

## 2013-06-25 MED ORDER — CEFAZOLIN SODIUM-DEXTROSE 2-3 GM-% IV SOLR
INTRAVENOUS | Status: DC | PRN
  Administered 2013-06-25: 2 g via INTRAVENOUS

## 2013-06-25 MED ORDER — FENTANYL CITRATE 0.05 MG/ML IJ SOLN
INTRAMUSCULAR | Status: AC
  Filled 2013-06-25: qty 5

## 2013-06-25 MED ORDER — SODIUM CHLORIDE 0.9 % IV SOLN
INTRAVENOUS | Status: DC | PRN
  Administered 2013-06-25: 09:00:00 via INTRAVENOUS

## 2013-06-25 MED ORDER — MIDAZOLAM HCL 5 MG/5ML IJ SOLN
INTRAMUSCULAR | Status: DC | PRN
  Administered 2013-06-25: 2 mg via INTRAVENOUS

## 2013-06-25 MED ORDER — FENTANYL CITRATE 0.05 MG/ML IJ SOLN
INTRAMUSCULAR | Status: DC | PRN
  Administered 2013-06-25 (×2): 50 ug via INTRAVENOUS

## 2013-06-25 MED ORDER — LIDOCAINE HCL (CARDIAC) 20 MG/ML IV SOLN
INTRAVENOUS | Status: AC
  Filled 2013-06-25: qty 5

## 2013-06-25 MED ORDER — ONDANSETRON HCL 4 MG/2ML IJ SOLN
INTRAMUSCULAR | Status: AC
  Filled 2013-06-25: qty 2

## 2013-06-25 MED ORDER — ROCURONIUM BROMIDE 50 MG/5ML IV SOLN
INTRAVENOUS | Status: AC
  Filled 2013-06-25: qty 1

## 2013-06-25 MED ORDER — PHENYLEPHRINE HCL 10 MG/ML IJ SOLN
INTRAMUSCULAR | Status: DC | PRN
  Administered 2013-06-25 (×3): 80 ug via INTRAVENOUS

## 2013-06-25 MED ORDER — ONDANSETRON HCL 4 MG/2ML IJ SOLN
INTRAMUSCULAR | Status: DC | PRN
  Administered 2013-06-25: 4 mg via INTRAVENOUS

## 2013-06-25 MED ORDER — EPHEDRINE SULFATE 50 MG/ML IJ SOLN
INTRAMUSCULAR | Status: DC | PRN
  Administered 2013-06-25 (×2): 10 mg via INTRAVENOUS

## 2013-06-25 MED ORDER — LIDOCAINE-EPINEPHRINE 1 %-1:100000 IJ SOLN
INTRAMUSCULAR | Status: DC | PRN
  Administered 2013-06-25: 20 mL

## 2013-06-25 MED ORDER — MIDAZOLAM HCL 2 MG/2ML IJ SOLN
INTRAMUSCULAR | Status: AC
  Filled 2013-06-25: qty 2

## 2013-06-25 MED ORDER — GLYCOPYRROLATE 0.2 MG/ML IJ SOLN
INTRAMUSCULAR | Status: DC | PRN
  Administered 2013-06-25: 0.2 mg via INTRAVENOUS

## 2013-06-25 SURGICAL SUPPLY — 38 items
BLADE SURG 15 STRL LF DISP TIS (BLADE) ×1 IMPLANT
BLADE SURG 15 STRL SS (BLADE) ×2
CLEANER TIP ELECTROSURG 2X2 (MISCELLANEOUS) ×3 IMPLANT
COVER SURGICAL LIGHT HANDLE (MISCELLANEOUS) ×3 IMPLANT
ELECT COATED BLADE 2.86 ST (ELECTRODE) ×3 IMPLANT
ELECT REM PT RETURN 9FT ADLT (ELECTROSURGICAL) ×3
ELECTRODE REM PT RTRN 9FT ADLT (ELECTROSURGICAL) ×1 IMPLANT
GAUZE SPONGE 4X4 16PLY XRAY LF (GAUZE/BANDAGES/DRESSINGS) ×3 IMPLANT
GLOVE BIOGEL PI IND STRL 7.0 (GLOVE) ×1 IMPLANT
GLOVE BIOGEL PI INDICATOR 7.0 (GLOVE) ×2
GLOVE ECLIPSE 8.0 STRL XLNG CF (GLOVE) ×3 IMPLANT
GLOVE SS BIOGEL STRL SZ 7.5 (GLOVE) ×2 IMPLANT
GLOVE SUPERSENSE BIOGEL SZ 7.5 (GLOVE) ×4
GLOVE SURG SS PI 7.0 STRL IVOR (GLOVE) ×3 IMPLANT
GOWN STRL REUS W/ TWL LRG LVL3 (GOWN DISPOSABLE) ×1 IMPLANT
GOWN STRL REUS W/ TWL XL LVL3 (GOWN DISPOSABLE) ×1 IMPLANT
GOWN STRL REUS W/TWL LRG LVL3 (GOWN DISPOSABLE) ×2
GOWN STRL REUS W/TWL XL LVL3 (GOWN DISPOSABLE) ×2
HOLDER TRACH TUBE VELCRO 19.5 (MISCELLANEOUS) ×3 IMPLANT
KIT BASIN OR (CUSTOM PROCEDURE TRAY) ×3 IMPLANT
KIT ROOM TURNOVER OR (KITS) ×3 IMPLANT
KIT SUCTION CATH 14FR (SUCTIONS) ×3 IMPLANT
NEEDLE HYPO 25X1 1.5 SAFETY (NEEDLE) ×3 IMPLANT
NS IRRIG 1000ML POUR BTL (IV SOLUTION) ×3 IMPLANT
PACK EENT II TURBAN DRAPE (CUSTOM PROCEDURE TRAY) ×3 IMPLANT
PAD ARMBOARD 7.5X6 YLW CONV (MISCELLANEOUS) ×6 IMPLANT
PENCIL BUTTON HOLSTER BLD 10FT (ELECTRODE) ×3 IMPLANT
SPONGE DRAIN TRACH 4X4 STRL 2S (GAUZE/BANDAGES/DRESSINGS) ×3 IMPLANT
SPONGE INTESTINAL PEANUT (DISPOSABLE) ×3 IMPLANT
SUT SILK 2 0 SH CR/8 (SUTURE) ×3 IMPLANT
SYR 20CC LL (SYRINGE) ×3 IMPLANT
SYR CONTROL 10ML LL (SYRINGE) ×3 IMPLANT
TOWEL OR 17X24 6PK STRL BLUE (TOWEL DISPOSABLE) ×3 IMPLANT
TOWEL OR 17X26 10 PK STRL BLUE (TOWEL DISPOSABLE) ×3 IMPLANT
TUBE CONNECTING 12'X1/4 (SUCTIONS) ×1
TUBE CONNECTING 12X1/4 (SUCTIONS) ×2 IMPLANT
TUBE TRACH SHILEY  6 DIST  CUF (TUBING) ×3 IMPLANT
YANKAUER SUCT BULB TIP NO VENT (SUCTIONS) ×3 IMPLANT

## 2013-06-25 NOTE — OR Nursing (Signed)
Noted to have foley in place on arrival to OR. Urine is clear yellow.

## 2013-06-25 NOTE — Brief Op Note (Signed)
06/18/2013 - 06/25/2013  9:52 AM  PATIENT:  Barbara CowerMartha Arnold  73 y.o. female  PRE-OPERATIVE DIAGNOSIS:  RESPIRATORY FAILURE  POST-OPERATIVE DIAGNOSIS:  RESPIRATORY FAILURE  PROCEDURE:  Procedure(s): TRACHEOSTOMY (N/A) (Shiley # 6 cuffed)  SURGEON:  Surgeon(s) and Role:    * Drema Halonhristopher E Laquentin Loudermilk, MD - Primary  PHYSICIAN ASSISTANT:   ASSISTANTS: none   ANESTHESIA:   general  EBL:   minimal  BLOOD ADMINISTERED:none  DRAINS: none   LOCAL MEDICATIONS USED:  XYLOCAINE with EPI 6 cc  SPECIMEN:  No Specimen  DISPOSITION OF SPECIMEN:  N/A  COUNTS:  YES  TOURNIQUET:  * No tourniquets in log *  DICTATION: .Other Dictation: Dictation Number [id number]  PLAN OF CARE: Discharge to home after PACU  PATIENT DISPOSITION:  PACU - hemodynamically stable.   Delay start of Pharmacological VTE agent (>24hrs) due to surgical blood loss or risk of bleeding: not applicable

## 2013-06-25 NOTE — Anesthesia Postprocedure Evaluation (Signed)
  Anesthesia Post-op Note  Patient: Barbara CowerMartha Stemm  Procedure(s) Performed: Procedure(s): TRACHEOSTOMY (N/A)  Patient Location: PACU  Anesthesia Type:General  Level of Consciousness: sedated  Airway and Oxygen Therapy: Patient Spontanous Breathing and Patient connected to T-piece oxygen  Post-op Pain: none  Post-op Assessment: Post-op Vital signs reviewed  Post-op Vital Signs: Reviewed  Complications: No apparent anesthesia complications

## 2013-06-25 NOTE — Progress Notes (Signed)
[facility]                                                                                              Progress note     Patient Demographics  Deborah Graves, is a 73 y.o. female  WJX:[id number]  [id number]:[id number]  DOB - 08-14-1940  Admit date - 06/18/2013  Admitting Physician Elnora MorrisonAhmad B Barakat, MD  Outpatient Primary MD for the patient is No primary provider on file.  LOS - 7   Chief complaint   Respiratory failure   Anxiety         Subjective:   Unable to obtain due to sedation, nausea and vomiting he reported that night subcutaneous feed was held  Objective:   Vital signs  Temperature  98 6 Heart rate 87  Respiratory rate  20 Blood pressure  133/55 Pulse ox  97%    Exam Sedated, No new F.N deficits, agitated at times [place].AT,  moist mucous membranes, ET tube in place Supple Neck,No JVD, No cervical lymphadenopathy appriciated.  Symmetrical Chest wall movement, diminished with scattered rhonchi and wheezing RRR,No Gallops,Rubs or new Murmurs, No Parasternal Heave +ve B.Sounds, Abd Soft, Non tender, No organomegaly appriciated, No rebound - guarding or rigidity. No Cyanosis, Clubbing with +1 edema bilaterally   I&Os  2612/950 ET tube  Data Review   CBC  Recent Labs Lab 06/18/13 1559 06/21/13 0530 06/22/13 0500 06/24/13 0615  WBC 13.6* 12.6* 11.0* 9.6  HGB 9.4* 9.3* 8.4* 7.7*  HCT 28.9* 28.8* 26.6* 24.8*  PLT 391 388 372 360  MCV 86.0 87.3 88.1 88.3  MCH 28.0 28.2 27.8 27.4  MCHC 32.5 32.3 31.6 31.0  RDW 14.3 14.3 14.4 14.8    Chemistries   Recent Labs Lab 06/18/13 1559 06/21/13 0530 06/22/13 0500 06/24/13 0615 06/25/13 0650  NA 140 141 143 139 140  K 3.4* 4.0 3.7 4.9 5.5*  CL 100 101 103 102 103  CO2 23 25 25 24 24   GLUCOSE 153* 152* 169* 108* 118*  BUN 25* 25* 26* 37* 45*  CREATININE 1.22* 1.07 1.50* 2.82* 3.16*  CALCIUM 8.9 9.0 8.8 8.8 8.8  MG  1.8  --   --   --   --   AST 24  --   --   --   --   ALT 28  --   --   --   --   ALKPHOS 117  --   --   --   --   BILITOT <0.2*  --   --   --   --      Cardiac Enzymes  Recent Labs Lab 06/18/13 1601  CKMB <1.0  TROPONINI <0.30   ------------------------------------------------------------------------------------------------------------------ No components found with this basename: POCBNP,   Micro Results Recent Results (from the past 240 hour(s))  CULTURE, RESPIRATORY (NON-EXPECTORATED)     Status: None   Collection Time    06/18/13  5:25 PM      Result Value Ref Range Status   Specimen Description TRACHEAL ASPIRATE   Final   Special Requests Normal   Final   Gram Stain  Final   Value: MODERATE WBC PRESENT,BOTH PMN AND MONONUCLEAR     NO SQUAMOUS EPITHELIAL CELLS SEEN     NO ORGANISMS SEEN     Performed at [facility]   Culture     Final   Value: FEW CANDIDA ALBICANS     Performed at [facility]   Report Status 06/22/2013 FINAL   Final  CULTURE, BLOOD (ROUTINE X 2)     Status: None   Collection Time    06/18/13  6:05 PM      Result Value Ref Range Status   Specimen Description BLOOD RIGHT HAND   Final   Special Requests BOTTLES DRAWN AEROBIC ONLY 4CC   Final   Culture  Setup Time     Final   Value: 06/18/2013 23:55     Performed at [facility]   Culture     Final   Value: NO GROWTH 5 DAYS     Performed at [facility]   Report Status 06/24/2013 FINAL   Final  CULTURE, BLOOD (ROUTINE X 2)     Status: None   Collection Time    06/18/13  6:13 PM      Result Value Ref Range Status   Specimen Description BLOOD LEFT HAND   Final   Special Requests     Final   Value: BOTTLES DRAWN AEROBIC AND ANAEROBIC 10CC AER 8CC ANA   Culture  Setup Time     Final   Value: 06/18/2013 23:55     Performed at [facility]   Culture     Final   Value: NO GROWTH 5 DAYS     Performed at [facility]   Report Status 06/24/2013  FINAL   Final  CLOSTRIDIUM DIFFICILE BY PCR     Status: None   Collection Time    06/21/13 10:38 AM      Result Value Ref Range Status   C difficile by pcr NEGATIVE  NEGATIVE Final       Assessment & Plan    VDRF , 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, we'll start weaning today Line sepsis/SIRS with staph hominis cultured in transferring hospital treated History of bilateral pneumonia; off IV antibiotics Mild non-ST elevation MI at transferring hospital, continue with statin COPD continue with nebulizer treatment Upper GI bleed with gastroccult positive in transferring hospital off aspirin for now Congestive heart failure; mixed systolic and diastolic with ejection fraction 38 percent; hold diuresis due to renal failure Diabetes mellitus type 2 continue Levemir and insulin sliding scale CVA continue with statin off aspirin due to GI bleed Hypothyroidism normal studies Anxiety disorder on high doses off Versed and fentanyl will start weaning and start Depakote or Noncompliance Protein calorie malnutrition on Glucerna 1.5 per tube, started on low dose again Acute on chronic kidney injury off ACE inhibitor and Lasix, continue with IV fluids  Plan Continue holding Lasix Follow labs in a.m. Consult nephrology  Critical care time 37 minutes  Code Status: Full  DVT Prophylaxis  heparin   Carron Curie M.D on 06/25/2013 at 11:28 AM

## 2013-06-25 NOTE — Anesthesia Preprocedure Evaluation (Signed)
Anesthesia Evaluation  Patient identified by MRN, date of birth, ID band Patient awake    Reviewed: Allergy & Precautions, H&P , NPO status , Patient's Chart, lab work & pertinent test results  Airway       Dental   Pulmonary pneumonia -, unresolved,  breath sounds clear to auscultation        Cardiovascular Rhythm:Regular Rate:Normal     Neuro/Psych    GI/Hepatic   Endo/Other    Renal/GU      Musculoskeletal   Abdominal   Peds  Hematology   Anesthesia Other Findings   Reproductive/Obstetrics                           Anesthesia Physical Anesthesia Plan  ASA: III  Anesthesia Plan: General   Post-op Pain Management:    Induction: Intravenous and Inhalational  Airway Management Planned: Oral ETT  Additional Equipment:   Intra-op Plan:   Post-operative Plan: Extubation in OR  Informed Consent:   Plan Discussed with: CRNA, Anesthesiologist and Surgeon  Anesthesia Plan Comments:         Anesthesia Quick Evaluation

## 2013-06-25 NOTE — Interval H&P Note (Signed)
History and Physical Interval Note:  06/25/2013 8:28 AM  Deborah Graves  has presented today for surgery, with the diagnosis of RESPIRATORY FAILURE  The various methods of treatment have been discussed with the patient and family. After consideration of risks, benefits and other options for treatment, the patient has consented to  Procedure(s): TRACHEOSTOMY (N/A) as a surgical intervention .  The patient's history has been reviewed, patient examined, no change in status, stable for surgery.  I have reviewed the patient's chart and labs.  Questions were answered to the patient's satisfaction.     NEWMAN, CHRISTOPHER

## 2013-06-25 NOTE — Transfer of Care (Signed)
Immediate Anesthesia Transfer of Care Note  Patient: Deborah Graves  Procedure(s) Performed: Procedure(s): TRACHEOSTOMY (N/A)  Patient Location: PACU  Anesthesia Type:General  Level of Consciousness: sedated  Airway & Oxygen Therapy: Patient placed on Ventilator (see vital sign flow sheet for setting)  Post-op Assessment: Report given to PACU RN and Post -op Vital signs reviewed and stable  Post vital signs: Reviewed and stable  Complications: No apparent anesthesia complications

## 2013-06-26 LAB — IRON AND TIBC
IRON: 12 ug/dL — AB (ref 42–135)
Saturation Ratios: 5 % — ABNORMAL LOW (ref 20–55)
TIBC: 244 ug/dL — ABNORMAL LOW (ref 250–470)
UIBC: 232 ug/dL (ref 125–400)

## 2013-06-26 LAB — CBC
HCT: 27.4 % — ABNORMAL LOW (ref 36.0–46.0)
Hemoglobin: 8.5 g/dL — ABNORMAL LOW (ref 12.0–15.0)
MCH: 27.4 pg (ref 26.0–34.0)
MCHC: 31 g/dL (ref 30.0–36.0)
MCV: 88.4 fL (ref 78.0–100.0)
PLATELETS: 353 10*3/uL (ref 150–400)
RBC: 3.1 MIL/uL — ABNORMAL LOW (ref 3.87–5.11)
RDW: 14.9 % (ref 11.5–15.5)
WBC: 11.7 10*3/uL — ABNORMAL HIGH (ref 4.0–10.5)

## 2013-06-26 LAB — POTASSIUM
POTASSIUM: 6.1 meq/L — AB (ref 3.7–5.3)
POTASSIUM: 6.2 meq/L — AB (ref 3.7–5.3)

## 2013-06-26 LAB — COMPREHENSIVE METABOLIC PANEL
ALBUMIN: 2.5 g/dL — AB (ref 3.5–5.2)
ALK PHOS: 111 U/L (ref 39–117)
ALT: 15 U/L (ref 0–35)
AST: 19 U/L (ref 0–37)
BUN: 48 mg/dL — AB (ref 6–23)
CO2: 23 mEq/L (ref 19–32)
Calcium: 8.1 mg/dL — ABNORMAL LOW (ref 8.4–10.5)
Chloride: 102 mEq/L (ref 96–112)
Creatinine, Ser: 3.07 mg/dL — ABNORMAL HIGH (ref 0.50–1.10)
GFR calc Af Amer: 17 mL/min — ABNORMAL LOW (ref >90)
GFR calc non Af Amer: 14 mL/min — ABNORMAL LOW (ref >90)
Glucose, Bld: 176 mg/dL — ABNORMAL HIGH (ref 70–99)
POTASSIUM: 6 meq/L — AB (ref 3.7–5.3)
SODIUM: 138 meq/L (ref 137–147)
Total Bilirubin: <0.2 mg/dL — ABNORMAL LOW (ref 0.3–1.2)
Total Protein: 6.4 g/dL (ref 6.0–8.3)

## 2013-06-26 LAB — CALCIUM / CREATININE RATIO, URINE
CALCIUM/CREAT. RATIO: 0
CREATININE, URINE: 79 mg/dL
Calcium, Ur: 1 mg/dL

## 2013-06-26 LAB — FERRITIN: Ferritin: 217 ng/mL (ref 10–291)

## 2013-06-26 NOTE — Progress Notes (Signed)
[facility]                                                                                              Progress note     Patient Demographics  Sahiba Granholm, is a 73 y.o. female  NWG:[id number]  [id number]:[id number]  DOB - [birth year]  Admit date - 06/18/2013  Admitting Physician Elnora Morrison, MD  Outpatient Primary MD for the patient is No primary provider on file.  LOS - 8   Chief complaint   Respiratory failure   Anxiety         Subjective:   Unable to obtain due to sedation, nausea and vomiting he reported that night subcutaneous feed was held  Objective:   Vital signs  Temperature  99.1  Heart rate 81 Respiratory rate  18 Blood pressure  142/72 Pulse ox  99%    Exam Sedated, No new F.N deficits, agitated at times [place].AT,  moist mucous membranes, ET tube in place Supple Neck,No JVD, No cervical lymphadenopathy appriciated.  Symmetrical Chest wall movement, diminished with scattered rhonchi and wheezing RRR,No Gallops,Rubs or new Murmurs, No Parasternal Heave +ve B.Sounds, Abd Soft, Non tender, No organomegaly appriciated, No rebound - guarding or rigidity. No Cyanosis, Clubbing with +1 edema bilaterally   I&Os  3181/750 Tracheostomy tube #6 Shiley placed on 4/3  Data Review   CBC  Recent Labs Lab 06/21/13 0530 06/22/13 0500 06/24/13 0615 06/26/13 0700  WBC 12.6* 11.0* 9.6 11.7*  HGB 9.3* 8.4* 7.7* 8.5*  HCT 28.8* 26.6* 24.8* 27.4*  PLT 388 372 360 353  MCV 87.3 88.1 88.3 88.4  MCH 28.2 27.8 27.4 27.4  MCHC 32.3 31.6 31.0 31.0  RDW 14.3 14.4 14.8 14.9    Chemistries   Recent Labs Lab 06/21/13 0530 06/22/13 0500 06/24/13 0615 06/25/13 0650 06/26/13 0700  NA 141 143 139 140 138  K 4.0 3.7 4.9 5.5* 6.0*  CL 101 103 102 103 102  CO2 25 25 24 24 23   GLUCOSE 152* 169* 108* 118* 176*  BUN 25* 26* 37* 45* 48*  CREATININE 1.07 1.50* 2.82* 3.16* 3.07*   CALCIUM 9.0 8.8 8.8 8.8 8.1*  AST  --   --   --   --  19  ALT  --   --   --   --  15  ALKPHOS  --   --   --   --  111  BILITOT  --   --   --   --  <0.2*     Cardiac Enzymes No results found for this basename: CK, CKMB, TROPONINI, MYOGLOBIN,  in the last 168 hours ------------------------------------------------------------------------------------------------------------------ No components found with this basename: POCBNP,   Micro Results Recent Results (from the past 240 hour(s))  CULTURE, RESPIRATORY (NON-EXPECTORATED)     Status: None   Collection Time    06/18/13  5:25 PM      Result Value Ref Range Status   Specimen Description TRACHEAL ASPIRATE   Final   Special Requests Normal   Final   Gram Stain     Final   Value: MODERATE WBC  PRESENT,BOTH PMN AND MONONUCLEAR     NO SQUAMOUS EPITHELIAL CELLS SEEN     NO ORGANISMS SEEN     Performed at [facility]   Culture     Final   Value: FEW CANDIDA ALBICANS     Performed at [facility]   Report Status 06/22/2013 FINAL   Final  CULTURE, BLOOD (ROUTINE X 2)     Status: None   Collection Time    06/18/13  6:05 PM      Result Value Ref Range Status   Specimen Description BLOOD RIGHT HAND   Final   Special Requests BOTTLES DRAWN AEROBIC ONLY 4CC   Final   Culture  Setup Time     Final   Value: 06/18/2013 23:55     Performed at [facility]   Culture     Final   Value: NO GROWTH 5 DAYS     Performed at [facility]   Report Status 06/24/2013 FINAL   Final  CULTURE, BLOOD (ROUTINE X 2)     Status: None   Collection Time    06/18/13  6:13 PM      Result Value Ref Range Status   Specimen Description BLOOD LEFT HAND   Final   Special Requests     Final   Value: BOTTLES DRAWN AEROBIC AND ANAEROBIC 10CC AER 8CC ANA   Culture  Setup Time     Final   Value: 06/18/2013 23:55     Performed at [facility]   Culture     Final   Value: NO GROWTH 5 DAYS     Performed at [facility]    Report Status 06/24/2013 FINAL   Final  CLOSTRIDIUM DIFFICILE BY PCR     Status: None   Collection Time    06/21/13 10:38 AM      Result Value Ref Range Status   C difficile by pcr NEGATIVE  NEGATIVE Final     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, no acute abnormalities  Assessment & Plan    VDRF , 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, we'll start weaning today Line sepsis/SIRS with staph hominis cultured in transferring hospital treated History of bilateral pneumonia; off IV antibiotics Mild non-ST elevation MI at transferring hospital, continue with statin COPD continue with nebulizer treatment Upper GI bleed with gastroccult positive in transferring hospital off aspirin for now Congestive heart failure; mixed systolic and diastolic with ejection fraction 38 percent; hold diuresis due to renal failure Diabetes mellitus type 2 continue Levemir and insulin sliding scale CVA continue with statin off aspirin due to GI bleed Hypothyroidism normal studies Anxiety disorder on high doses off Versed and fentanyl will start weaning and start Depakote or Noncompliance Protein calorie malnutrition on Glucerna 1.5 per tube, started on low dose again Acute on chronic kidney injury off ACE inhibitor and Lasix, continue with IV fluids Hyperkalemia  Plan  Kayexalate Check BMP in a.m.  Critical care time 34 minutes  Code Status: Full  DVT Prophylaxis  heparin   Carron CurieHijazi, Elnora Quizon M.D on 06/26/2013 at 12:55 PM

## 2013-06-27 ENCOUNTER — Other Ambulatory Visit (HOSPITAL_COMMUNITY): Payer: Self-pay

## 2013-06-27 LAB — BASIC METABOLIC PANEL
BUN: 50 mg/dL — ABNORMAL HIGH (ref 6–23)
CO2: 23 meq/L (ref 19–32)
Calcium: 8.7 mg/dL (ref 8.4–10.5)
Chloride: 102 mEq/L (ref 96–112)
Creatinine, Ser: 3.02 mg/dL — ABNORMAL HIGH (ref 0.50–1.10)
GFR calc Af Amer: 17 mL/min — ABNORMAL LOW (ref >90)
GFR, EST NON AFRICAN AMERICAN: 15 mL/min — AB (ref >90)
GLUCOSE: 229 mg/dL — AB (ref 70–99)
POTASSIUM: 5.1 meq/L (ref 3.7–5.3)
SODIUM: 142 meq/L (ref 137–147)

## 2013-06-27 NOTE — Progress Notes (Signed)
[facility]                                                                                              Progress note     Patient Demographics  Deborah Graves, is a 73 y.o. female  ZOX:[id number]  [id number]:[id number]  DOB - May 05, 1940  Admit date - 06/18/2013  Admitting Physician Elnora Morrison, MD  Outpatient Primary MD for the patient is No primary provider on file.  LOS - 9   Chief complaint   Respiratory failure   Anxiety         Subjective:   Unable to obtain due to sedation, nausea and vomiting he reported that night subcutaneous feed was held  Objective:   Vital signs  Temperature  98.1  Heart rate 99 Respiratory rate  14 Blood pressure  114/57 Pulse ox  99%    Exam Sedated, No new F.N deficits, agitated at times [place].AT,  moist mucous membranes, ET tube in place Supple Neck,No JVD, No cervical lymphadenopathy appriciated.  Symmetrical Chest wall movement, diminished with scattered rhonchi and wheezing RRR,No Gallops,Rubs or new Murmurs, No Parasternal Heave +ve B.Sounds, Abd Soft, Non tender, No organomegaly appriciated, No rebound - guarding or rigidity. No Cyanosis, Clubbing with +1 edema bilaterally   I&Os  2540/1125 Tracheostomy tube #6 Shiley placed on 4/3  Data Review   CBC  Recent Labs Lab 06/21/13 0530 06/22/13 0500 06/24/13 0615 06/26/13 0700  WBC 12.6* 11.0* 9.6 11.7*  HGB 9.3* 8.4* 7.7* 8.5*  HCT 28.8* 26.6* 24.8* 27.4*  PLT 388 372 360 353  MCV 87.3 88.1 88.3 88.4  MCH 28.2 27.8 27.4 27.4  MCHC 32.3 31.6 31.0 31.0  RDW 14.3 14.4 14.8 14.9    Chemistries   Recent Labs Lab 06/22/13 0500 06/24/13 0615 06/25/13 0650 06/26/13 0700 06/26/13 1906 06/26/13 2230 06/27/13 0600  NA 143 139 140 138  --   --  142  K 3.7 4.9 5.5* 6.0* 6.1* 6.2* 5.1  CL 103 102 103 102  --   --  102  CO2 25 24 24 23   --   --  23  GLUCOSE 169* 108* 118* 176*  --    --  229*  BUN 26* 37* 45* 48*  --   --  50*  CREATININE 1.50* 2.82* 3.16* 3.07*  --   --  3.02*  CALCIUM 8.8 8.8 8.8 8.1*  --   --  8.7  AST  --   --   --  19  --   --   --   ALT  --   --   --  15  --   --   --   ALKPHOS  --   --   --  111  --   --   --   BILITOT  --   --   --  <0.2*  --   --   --      Cardiac Enzymes No results found for this basename: CK, CKMB, TROPONINI, MYOGLOBIN,  in the last 168 hours ------------------------------------------------------------------------------------------------------------------ No components found with this basename: POCBNP,  Micro Results Recent Results (from the past 240 hour(s))  CULTURE, RESPIRATORY (NON-EXPECTORATED)     Status: None   Collection Time    06/18/13  5:25 PM      Result Value Ref Range Status   Specimen Description TRACHEAL ASPIRATE   Final   Special Requests Normal   Final   Gram Stain     Final   Value: MODERATE WBC PRESENT,BOTH PMN AND MONONUCLEAR     NO SQUAMOUS EPITHELIAL CELLS SEEN     NO ORGANISMS SEEN     Performed at [facility]   Culture     Final   Value: FEW CANDIDA ALBICANS     Performed at [facility]   Report Status 06/22/2013 FINAL   Final  CULTURE, BLOOD (ROUTINE X 2)     Status: None   Collection Time    06/18/13  6:05 PM      Result Value Ref Range Status   Specimen Description BLOOD RIGHT HAND   Final   Special Requests BOTTLES DRAWN AEROBIC ONLY 4CC   Final   Culture  Setup Time     Final   Value: 06/18/2013 23:55     Performed at [facility]   Culture     Final   Value: NO GROWTH 5 DAYS     Performed at [facility]   Report Status 06/24/2013 FINAL   Final  CULTURE, BLOOD (ROUTINE X 2)     Status: None   Collection Time    06/18/13  6:13 PM      Result Value Ref Range Status   Specimen Description BLOOD LEFT HAND   Final   Special Requests     Final   Value: BOTTLES DRAWN AEROBIC AND ANAEROBIC 10CC AER 8CC ANA   Culture  Setup Time     Final    Value: 06/18/2013 23:55     Performed at [facility]   Culture     Final   Value: NO GROWTH 5 DAYS     Performed at [facility]   Report Status 06/24/2013 FINAL   Final  CLOSTRIDIUM DIFFICILE BY PCR     Status: None   Collection Time    06/21/13 10:38 AM      Result Value Ref Range Status   C difficile by pcr NEGATIVE  NEGATIVE Final     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, no acute abnormalities  Assessment & Plan    VDRF , 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,  Weaning failed today Line sepsis/SIRS with staph hominis cultured in transferring hospital treated History of bilateral pneumonia; off IV antibiotics Mild non-ST elevation MI at transferring hospital, continue with statin COPD continue with nebulizer treatment Upper GI bleed with gastroccult positive in transferring hospital off aspirin for now Congestive heart failure; mixed systolic and diastolic with ejection fraction 38 percent; hold diuresis due to renal failure Diabetes mellitus type 2 continue Levemir and insulin sliding scale CVA continue with statin off aspirin due to GI bleed Hypothyroidism normal studies Anxiety disorder on high doses off Versed and fentanyl will start weaning and start Depakote or Noncompliance Protein calorie malnutrition on Glucerna 1.5 per tube, started on low dose again Acute on chronic kidney injury off ACE inhibitor and Lasix, continue with IV fluids Hyperkalemia Hypertension; improved Encephalopathy off Versed drip  Plan  Continue with IV fluids Check labs in a.m. Check chest x-ray  in a.m. Check abdominal x-ray in a.m. Extra Kayexalate today  Critical care time 34 minutes  Code Status: Full  DVT Prophylaxis  heparin   Carron Curie M.D on 06/27/2013 at 11:22 AM

## 2013-06-28 ENCOUNTER — Encounter (HOSPITAL_COMMUNITY): Payer: Self-pay | Admitting: Otolaryngology

## 2013-06-28 ENCOUNTER — Other Ambulatory Visit (HOSPITAL_COMMUNITY): Payer: Self-pay

## 2013-06-28 LAB — BASIC METABOLIC PANEL
BUN: 48 mg/dL — ABNORMAL HIGH (ref 6–23)
CO2: 23 meq/L (ref 19–32)
CREATININE: 2.89 mg/dL — AB (ref 0.50–1.10)
Calcium: 8.6 mg/dL (ref 8.4–10.5)
Chloride: 100 mEq/L (ref 96–112)
GFR calc Af Amer: 18 mL/min — ABNORMAL LOW (ref >90)
GFR calc non Af Amer: 15 mL/min — ABNORMAL LOW (ref >90)
Glucose, Bld: 113 mg/dL — ABNORMAL HIGH (ref 70–99)
POTASSIUM: 4.1 meq/L (ref 3.7–5.3)
Sodium: 140 mEq/L (ref 137–147)

## 2013-06-28 LAB — CBC
HEMATOCRIT: 24.7 % — AB (ref 36.0–46.0)
Hemoglobin: 7.9 g/dL — ABNORMAL LOW (ref 12.0–15.0)
MCH: 27.8 pg (ref 26.0–34.0)
MCHC: 32 g/dL (ref 30.0–36.0)
MCV: 87 fL (ref 78.0–100.0)
Platelets: 353 10*3/uL (ref 150–400)
RBC: 2.84 MIL/uL — AB (ref 3.87–5.11)
RDW: 14.7 % (ref 11.5–15.5)
WBC: 11.2 10*3/uL — AB (ref 4.0–10.5)

## 2013-06-28 NOTE — Progress Notes (Signed)
PULMONARY / CRITICAL CARE MEDICINE   Name: Deborah Graves MRN: [id number] DOB: Apr 16, 1940    CONSULTATION DATE:  06/21/2013   CHIEF COMPLAINT:  Acute respiratory failure  BRIEF PATIENT DESCRIPTION:  73 y/o female admitted to outside hospital with COPD, pneumonia, sepsis, Staph hominas bacteremia and hypercarbic respiratory failure requiring intubation.  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   SUBJECTIVE: RT reports tachypnea, no overt distress  PHYSICAL EXAMINATION: General:  Chronically ill appearing white female, tachypnea on vent Neuro:  left side weak HEENT:  Orally intubated  Cardiovascular:  rrr Lungs:  Tachypnea on vent, good volumes, cattered rhonchi  Abdomen:  Soft, obese, positive bowel sounds  Musculoskeletal:  Generalized edema  Skin:  Scattered area of ecchymosis   LABS:  Recent Labs Lab 06/26/13 0700  06/26/13 2230 06/27/13 0600 06/28/13 0530  NA 138  --   --  142 140  K 6.0*  < > 6.2* 5.1 4.1  CL 102  --   --  102 100  CO2 23  --   --  23 23  BUN 48*  --   --  50* 48*  CREATININE 3.07*  --   --  3.02* 2.89*  GLUCOSE 176*  --   --  229* 113*  < > = values in this interval not displayed.  Recent Labs Lab 06/24/13 0615 06/26/13 0700 06/28/13 0530  HGB 7.7* 8.5* 7.9*  HCT 24.8* 27.4* 24.7*  WBC 9.6 11.7* 11.2*  PLT 360 353 353   IMAGING: Dg Chest Port 1 View  06/28/2013   CLINICAL DATA:  Respiratory failure.  EXAM: PORTABLE CHEST - 1 VIEW  COMPARISON:  DG CHEST 1V PORT dated 06/24/2013  FINDINGS: ET tube has been replaced with a tracheostomy which is in good position. The heart is enlarged. There is mild vascular congestion with bilateral effusions. Overall aeration is stable.  IMPRESSION: Tracheostomy tube good position.  Stable aeration.   Electronically Signed   By: Davonna Belling M.D.   On: 06/28/2013 07:49   Dg Abd Portable 1v  06/28/2013    CLINICAL DATA:  Abdominal distention.  EXAM: PORTABLE ABDOMEN - 1 VIEW  COMPARISON:  DG ABD PORTABLE 1V dated 06/27/2013  FINDINGS: Nasogastric tube is located within the stomach which is decompressed. Gas pattern is nonspecific. Bilateral tubal ligation clips. Cardiac enlargement. Low lung volumes.  IMPRESSION: Unremarkable portable supine abdomen.   Electronically Signed   By: Davonna Belling M.D.   On: 06/28/2013 07:49   Dg Abd Portable 1v  06/27/2013   CLINICAL DATA:  Abdominal pain  EXAM: PORTABLE ABDOMEN - 1 VIEW  COMPARISON:  06/21/2013  FINDINGS: A nasogastric catheter is again noted within the stomach. Scattered large and small bowel gas is seen. No obstructive changes are noted. No free air is seen.  IMPRESSION: No acute abnormality noted.   Electronically Signed   By: Alcide Clever M.D.   On: 06/27/2013 09:55   ASSESSMENT: Acute respiratory failure Acute on chronic systolic heart failure Pulmonary edema HCAP Suspectd COPD without evidence of exacerbation Possible CVA  Acute encephalopathy / delirium Psychiatric history?  PLAN: Precedex, can we wean this to off Allow tachypnea with good volumes, volumes 474, allow rate 35 Continue Depakote  Bronchodilators inhaled steroids, no indication for systemic Antibiotics per primary team Aim for negative fluid balance and avoid tachycardia Family would not want prolonged care, would consider palliation if weaning  is unsuccessful  Ps 12 goal today  I have personally obtained history, examined patient, evaluated and interpreted laboratory and imaging results, reviewed medical records, formulated assessment / plan and placed orders.   06/28/2013, 11:19 AM  Mcarthur Rossettianiel J. Tyson AliasFeinstein, MD, FACP Pgr: [phone] [facility]

## 2013-06-29 LAB — UIFE/LIGHT CHAINS/TP QN, 24-HR UR
ALPHA 1 UR: DETECTED — AB
Albumin, U: DETECTED
Alpha 2, Urine: DETECTED — AB
BETA UR: DETECTED — AB
FREE KAPPA LT CHAINS, UR: 39.3 mg/dL — AB (ref 0.14–2.42)
Free Kappa/Lambda Ratio: 3.61 ratio (ref 2.04–10.37)
Free Lambda Lt Chains,Ur: 10.9 mg/dL — ABNORMAL HIGH (ref 0.02–0.67)
GAMMA UR: DETECTED — AB
Total Protein, Urine: 59.6 mg/dL

## 2013-06-29 NOTE — Op Note (Unsigned)
NAMBarbara Graves:  Diclemente, Daissy             ACCOUNT NO.:  [id number]  MEDICAL RECORD NO.:  [id number]  LOCATION:  RESPT                        FACILITY:  [facility]  PHYSICIAN:  Kristine GarbeChristopher E. Ezzard StandingNewman, M.D.DATE OF BIRTH:  [birth year]  DATE OF PROCEDURE:  06/25/2013 DATE OF DISCHARGE:                              OPERATIVE REPORT   PREOPERATIVE DIAGNOSIS:  Respiratory failure.  POSTOP DIAGNOSIS:  Respiratory failure.  OPERATION PERFORMED:  Tracheostomy with a #6 Shiley cuffed tube.  SURGEON:  Kristine GarbeChristopher E. Ezzard StandingNewman, MD  ANESTHESIA:  General.  COMPLICATIONS:  NONE.  BRIEF CLINICAL NOTE:  Deborah CowerMartha Graves is a 73 year old female who initially presented to an outside emergency room with mental status changes and respiratory failure.  She was intubated and has been intubated for about 3 weeks.  She has failed extubation and had to be reintubated and [facility].  She is presently intubated on a ventilator, and bedside tracheostomy was not felt to be appropriate and the patient was subsequently referred to me for tracheostomy in the operating room.  She was taken the OR at this time for placement of a tracheostomy.  DESCRIPTION OF PROCEDURE:  [facility] and was left in her bed.  She was positioned with a roll beneath the shoulders.  The neck was prepped with Betadine solution and draped in a sterile towels.  A vertical incision was made just above the suprasternal notch.  Dissection was carried down through the subcutaneous tissue.  Strap muscles were identified and divided in midline longitudinally and retracted laterally.  The thyroid isthmus was identified overlying the second and third tracheal rings.  The thyroid isthmus was divided with cautery.  The first 3 tracheal rings were identified.  A horizontal tracheotomy was made between the second and third tracheal rings and an  endotracheal tube was withdrawn under direct visualization, and a #6 cuffed Shiley tube was inserted without any difficulty.  The patient had good ventilation with #6 Shiley trach. This was secured to the skin with 2-0 silk sutures x4 and Velcro trach holder around the neck.  The patient subsequently transferred back to the recovery care center and will be transferred later on this morning [facility].         ______________________________ Kristine Garbehristopher E. Ezzard StandingNewman, M.D.    CEN/MEDQ  D:  06/25/2013  T:  06/25/2013  Job:  [id number]

## 2013-06-29 NOTE — H&P (Signed)
Deborah Graves is an 73 y.o. female.   Chief Complaint: Low albumin Protein calorie malnutrition- severe CVA; NSTEMI; CHF; ARF- on vent/trach No response Request made for percutaneous gastric tube placement Long term care  HPI: ARF; COPD; sepsis; CHF; CAD/NSTEMI; CVA  No past medical history on file.  Past Surgical History  Procedure Laterality Date  . Tracheostomy tube placement N/A 06/25/2013    Procedure: TRACHEOSTOMY;  Surgeon: Rozetta Nunnery, MD;  Location: [facility];  Service: ENT;  Laterality: N/A;    No family history on file. Social History:  has no tobacco, alcohol, and drug history on file.  Allergies: Not on File  Medications Prior to Admission  Medication Sig Dispense Refill  . atorvastatin (LIPITOR) 20 MG tablet Take 20 mg by mouth daily.      . citalopram (CELEXA) 10 MG tablet Take 10 mg by mouth daily.      Marland Kitchen lisinopril (PRINIVIL,ZESTRIL) 5 MG tablet Take 5 mg by mouth daily.      . metoprolol tartrate (LOPRESSOR) 25 MG tablet Take 25 mg by mouth 2 (two) times daily.      . pantoprazole sodium (PROTONIX) 40 mg/20 mL PACK Place 40 mg into feeding tube 2 (two) times daily.      . potassium chloride 40 MEQ/15ML (20%) LIQD Take 40 mEq by mouth daily.      . QUEtiapine (SEROQUEL) 50 MG tablet Take 50 mg by mouth 2 (two) times daily.      . Valproic Acid (DEPAKENE) 250 MG/5ML SYRP syrup Take 250 mg by mouth 2 (two) times daily.        Results for orders placed during the hospital encounter of 06/18/13 (from the past 48 hour(s))  CBC     Status: Abnormal   Collection Time    06/28/13  5:30 AM      Result Value Ref Range   WBC 11.2 (*) 4.0 - 10.5 K/uL   RBC 2.84 (*) 3.87 - 5.11 MIL/uL   Hemoglobin 7.9 (*) 12.0 - 15.0 g/dL   HCT 24.7 (*) 36.0 - 46.0 %   MCV 87.0  78.0 - 100.0 fL   MCH 27.8  26.0 - 34.0 pg   MCHC 32.0  30.0 - 36.0 g/dL   RDW 14.7  11.5 - 15.5 %   Platelets 353  150 - 400 K/uL  BASIC METABOLIC PANEL     Status: Abnormal   Collection Time   06/28/13  5:30 AM      Result Value Ref Range   Sodium 140  137 - 147 mEq/L   Potassium 4.1  3.7 - 5.3 mEq/L   Comment: DELTA CHECK NOTED   Chloride 100  96 - 112 mEq/L   CO2 23  19 - 32 mEq/L   Glucose, Bld 113 (*) 70 - 99 mg/dL   BUN 48 (*) 6 - 23 mg/dL   Creatinine, Ser 2.89 (*) 0.50 - 1.10 mg/dL   Calcium 8.6  8.4 - 10.5 mg/dL   GFR calc non Af Amer 15 (*) >90 mL/min   GFR calc Af Amer 18 (*) >90 mL/min   Comment: (NOTE)     The eGFR has been calculated using the CKD EPI equation.     This calculation has not been validated in all clinical situations.     eGFR's persistently <90 mL/min signify possible Chronic Kidney     Disease.   Dg Chest Port 1 View  06/28/2013   CLINICAL DATA:  Respiratory failure.  EXAM:  PORTABLE CHEST - 1 VIEW  COMPARISON:  DG CHEST 1V PORT dated 06/24/2013  FINDINGS: ET tube has been replaced with a tracheostomy which is in good position. The heart is enlarged. There is mild vascular congestion with bilateral effusions. Overall aeration is stable.  IMPRESSION: Tracheostomy tube good position.  Stable aeration.   Electronically Signed   By: Rolla Flatten M.D.   On: 06/28/2013 07:49   Dg Abd Portable 1v  06/28/2013   CLINICAL DATA:  Abdominal distention.  EXAM: PORTABLE ABDOMEN - 1 VIEW  COMPARISON:  DG ABD PORTABLE 1V dated 06/27/2013  FINDINGS: Nasogastric tube is located within the stomach which is decompressed. Gas pattern is nonspecific. Bilateral tubal ligation clips. Cardiac enlargement. Low lung volumes.  IMPRESSION: Unremarkable portable supine abdomen.   Electronically Signed   By: Rolla Flatten M.D.   On: 06/28/2013 07:49    Review of Systems  Constitutional: Positive for weight loss. Negative for fever.       On vent  Neurological: Positive for weakness.       L hemiparesis CVA    Blood pressure 105/42, pulse 76, temperature 98.3 F (36.8 C), resp. rate 14, SpO2 97.00%. Physical Exam  Constitutional:  On vent; no response  Cardiovascular: Normal  rate.   No murmur heard. Respiratory: Effort normal. She has wheezes.  vent  GI: Soft. Bowel sounds are normal.  No signs of surgical scars  Psychiatric:  Consented dtr in room     Assessment/Plan Prot calorie malnutrition ARF- vent No response Long term care Scheduled for perc G tube in IR tomorrow dtr aware of procedure benefits and risks and agreeable to proceed Consent signed and in chart Ancef ordered Check kub Hep inj held   [place] A 06/29/2013, 3:12 PM

## 2013-06-30 ENCOUNTER — Other Ambulatory Visit (HOSPITAL_COMMUNITY): Payer: Self-pay

## 2013-06-30 LAB — PROTIME-INR
INR: 1.36 (ref 0.00–1.49)
Prothrombin Time: 16.4 seconds — ABNORMAL HIGH (ref 11.6–15.2)

## 2013-06-30 LAB — BLOOD GAS, ARTERIAL
Acid-Base Excess: 0.6 mmol/L (ref 0.0–2.0)
Bicarbonate: 25.3 mEq/L — ABNORMAL HIGH (ref 20.0–24.0)
FIO2: 40 %
LHR: 18 {breaths}/min
MECHVT: 450 mL
O2 SAT: 100 %
PCO2 ART: 44.5 mmHg (ref 35.0–45.0)
PEEP: 5 cmH2O
Patient temperature: 98.6
TCO2: 26.6 mmol/L (ref 0–100)
pH, Arterial: 7.373 (ref 7.350–7.450)
pO2, Arterial: 398 mmHg — ABNORMAL HIGH (ref 80.0–100.0)

## 2013-06-30 LAB — CBC
HEMATOCRIT: 25 % — AB (ref 36.0–46.0)
Hemoglobin: 8 g/dL — ABNORMAL LOW (ref 12.0–15.0)
MCH: 27.6 pg (ref 26.0–34.0)
MCHC: 32 g/dL (ref 30.0–36.0)
MCV: 86.2 fL (ref 78.0–100.0)
Platelets: 285 10*3/uL (ref 150–400)
RBC: 2.9 MIL/uL — AB (ref 3.87–5.11)
RDW: 14.4 % (ref 11.5–15.5)
WBC: 12 10*3/uL — ABNORMAL HIGH (ref 4.0–10.5)

## 2013-06-30 LAB — BASIC METABOLIC PANEL
BUN: 47 mg/dL — AB (ref 6–23)
CALCIUM: 8.9 mg/dL (ref 8.4–10.5)
CO2: 22 meq/L (ref 19–32)
CREATININE: 2.92 mg/dL — AB (ref 0.50–1.10)
Chloride: 99 mEq/L (ref 96–112)
GFR calc Af Amer: 18 mL/min — ABNORMAL LOW (ref >90)
GFR, EST NON AFRICAN AMERICAN: 15 mL/min — AB (ref >90)
GLUCOSE: 102 mg/dL — AB (ref 70–99)
Potassium: 4.3 mEq/L (ref 3.7–5.3)
SODIUM: 138 meq/L (ref 137–147)

## 2013-06-30 LAB — APTT: aPTT: <20 seconds — ABNORMAL LOW (ref 24–37)

## 2013-06-30 NOTE — Progress Notes (Signed)
PULMONARY / CRITICAL CARE MEDICINE   Name: Deborah CowerMartha Graves MRN: [id number] DOB: Dec 18, 1940    CONSULTATION DATE:  06/21/2013   CHIEF COMPLAINT:  Acute respiratory failure  BRIEF PATIENT DESCRIPTION:  73 y/o female admitted to outside hospital with COPD, pneumonia, sepsis, Staph hominas bacteremia and hypercarbic respiratory failure requiring intubation.  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   SUBJECTIVE: NAD lethargic  PHYSICAL EXAMINATION: General:  Chronically ill appearing white female Neuro:  left side weak HEENT:  Trach cdi Cardiovascular:  rrr Lungs: mild rhonchi Abdomen:  Soft, obese, positive bowel sounds  Musculoskeletal:  Generalized edema  Skin:  Scattered area of ecchymosis   LABS:  Recent Labs Lab 06/27/13 0600 06/28/13 0530 06/30/13 0418  NA 142 140 138  K 5.1 4.1 4.3  CL 102 100 99  CO2 23 23 22   BUN 50* 48* 47*  CREATININE 3.02* 2.89* 2.92*  GLUCOSE 229* 113* 102*    Recent Labs Lab 06/26/13 0700 06/28/13 0530 06/30/13 0500  HGB 8.5* 7.9* 8.0*  HCT 27.4* 24.7* 25.0*  WBC 11.7* 11.2* 12.0*  PLT 353 353 285   IMAGING: Dg Abd Portable 1v  06/30/2013   CLINICAL DATA:  PEG placement. Evaluate for barium retention in the bowel.  EXAM: PORTABLE ABDOMEN - 1 VIEW  COMPARISON:  DG ABD PORTABLE 1V dated 06/28/2013  FINDINGS: Nasogastric tube terminates in the stomach. Bowel gas pattern is unremarkable. There may be minimal retained barium in the sigmoid colon and splenic flexure. Probable left pleural effusion.  IMPRESSION: Probable minimal retained barium in the splenic flexure and sigmoid colon.   Electronically Signed   By: Leanna BattlesMelinda  Blietz M.D.   On: 06/30/2013 07:49   ASSESSMENT: Acute respiratory failure Acute on chronic systolic heart failure Pulmonary edema HCAP Suspectd COPD without evidence of exacerbation Possible CVA  Acute encephalopathy  / delirium Psychiatric history?  PLAN: Continue Depakote  Bronchodilators inhaled steroids, no indication for systemic Antibiotics per primary team Family would not want prolonged care, would consider palliation if weaning is unsuccessful  Ps 12 goal today, goal 8 hrs Keep on slow weaning protocol select  [facility] ACNP [facility] Pager [phone] till 3 pm If no answer page [phone] 06/30/2013, 2:05 PM   I have fully examined this patient and agree with above findings.    And edited infull  Deborah RossettiDaniel J. Graves AliasFeinstein, MD, FACP Pgr: [phone] [place] [facility]

## 2013-07-01 ENCOUNTER — Other Ambulatory Visit (HOSPITAL_COMMUNITY): Payer: Self-pay

## 2013-07-01 LAB — CBC
HCT: 25.9 % — ABNORMAL LOW (ref 36.0–46.0)
Hemoglobin: 8.2 g/dL — ABNORMAL LOW (ref 12.0–15.0)
MCH: 27.4 pg (ref 26.0–34.0)
MCHC: 31.7 g/dL (ref 30.0–36.0)
MCV: 86.6 fL (ref 78.0–100.0)
PLATELETS: 367 10*3/uL (ref 150–400)
RBC: 2.99 MIL/uL — AB (ref 3.87–5.11)
RDW: 14.3 % (ref 11.5–15.5)
WBC: 10.7 10*3/uL — ABNORMAL HIGH (ref 4.0–10.5)

## 2013-07-02 ENCOUNTER — Other Ambulatory Visit (HOSPITAL_COMMUNITY): Payer: Self-pay

## 2013-07-02 LAB — CBC WITH DIFFERENTIAL/PLATELET
BASOS PCT: 1 % (ref 0–1)
Basophils Absolute: 0.1 10*3/uL (ref 0.0–0.1)
Eosinophils Absolute: 0.4 10*3/uL (ref 0.0–0.7)
Eosinophils Relative: 5 % (ref 0–5)
HCT: 22.3 % — ABNORMAL LOW (ref 36.0–46.0)
HEMOGLOBIN: 7.3 g/dL — AB (ref 12.0–15.0)
Lymphocytes Relative: 8 % — ABNORMAL LOW (ref 12–46)
Lymphs Abs: 0.6 10*3/uL — ABNORMAL LOW (ref 0.7–4.0)
MCH: 27.9 pg (ref 26.0–34.0)
MCHC: 32.7 g/dL (ref 30.0–36.0)
MCV: 85.1 fL (ref 78.0–100.0)
MONOS PCT: 9 % (ref 3–12)
Monocytes Absolute: 0.8 10*3/uL (ref 0.1–1.0)
NEUTROS ABS: 6.3 10*3/uL (ref 1.7–7.7)
NEUTROS PCT: 77 % (ref 43–77)
Platelets: 295 10*3/uL (ref 150–400)
RBC: 2.62 MIL/uL — ABNORMAL LOW (ref 3.87–5.11)
RDW: 14.3 % (ref 11.5–15.5)
WBC: 8.1 10*3/uL (ref 4.0–10.5)

## 2013-07-02 LAB — RENAL FUNCTION PANEL
Albumin: 2.2 g/dL — ABNORMAL LOW (ref 3.5–5.2)
BUN: 49 mg/dL — ABNORMAL HIGH (ref 6–23)
CHLORIDE: 95 meq/L — AB (ref 96–112)
CO2: 25 meq/L (ref 19–32)
Calcium: 8.5 mg/dL (ref 8.4–10.5)
Creatinine, Ser: 2.62 mg/dL — ABNORMAL HIGH (ref 0.50–1.10)
GFR calc non Af Amer: 17 mL/min — ABNORMAL LOW (ref >90)
GFR, EST AFRICAN AMERICAN: 20 mL/min — AB (ref >90)
Glucose, Bld: 183 mg/dL — ABNORMAL HIGH (ref 70–99)
POTASSIUM: 4.5 meq/L (ref 3.7–5.3)
Phosphorus: 4.8 mg/dL — ABNORMAL HIGH (ref 2.3–4.6)
SODIUM: 134 meq/L — AB (ref 137–147)

## 2013-07-02 MED ORDER — IOHEXOL 300 MG/ML  SOLN
50.0000 mL | Freq: Once | INTRAMUSCULAR | Status: AC | PRN
  Administered 2013-07-02: 20 mL

## 2013-07-02 MED ORDER — GLUCAGON HCL (RDNA) 1 MG IJ SOLR
INTRAMUSCULAR | Status: AC | PRN
  Administered 2013-07-02: 1 mg via INTRAVENOUS

## 2013-07-02 MED ORDER — MIDAZOLAM HCL 2 MG/2ML IJ SOLN
INTRAMUSCULAR | Status: AC | PRN
  Administered 2013-07-02: 1 mg via INTRAVENOUS

## 2013-07-02 MED ORDER — FENTANYL CITRATE 0.05 MG/ML IJ SOLN
INTRAMUSCULAR | Status: AC | PRN
  Administered 2013-07-02: 50 ug via INTRAVENOUS

## 2013-07-02 NOTE — Progress Notes (Signed)
PULMONARY / CRITICAL CARE MEDICINE   Name: Deborah CowerMartha Graves MRN: [id number] DOB: [birth year]    CONSULTATION DATE:  06/21/2013   CHIEF COMPLAINT:  Acute respiratory failure  BRIEF PATIENT DESCRIPTION:  73 y/o female admitted to outside hospital with COPD, pneumonia, sepsis, Staph hominas bacteremia and hypercarbic respiratory failure requiring intubation.  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   SUBJECTIVE: S/P PEG  PHYSICAL EXAMINATION: General:  Chronically ill appearing white female Neuro:  left side weak HEENT:  Trach cdi Cardiovascular:  rrr Lungs: mild rhonchi Abdomen:  Soft, obese, positive bowel sounds  Musculoskeletal:  Generalized edema  Skin:  Scattered area of ecchymosis   LABS:  Recent Labs Lab 06/28/13 0530 06/30/13 0418 07/02/13 0500  NA 140 138 134*  K 4.1 4.3 4.5  CL 100 99 95*  CO2 23 22 25   BUN 48* 47* 49*  CREATININE 2.89* 2.92* 2.62*  GLUCOSE 113* 102* 183*    Recent Labs Lab 06/30/13 0500 07/01/13 0515 07/02/13 0500  HGB 8.0* 8.2* 7.3*  HCT 25.0* 25.9* 22.3*  WBC 12.0* 10.7* 8.1  PLT 285 367 295   IMAGING: Ir Gastrostomy Tube Mod Sed  07/02/2013   INDICATION: History of CVA, myocardial infarction, congestive heart failure, respiratory failure, post tracheostomy, request made for percutaneous gastrostomy tube placement for enteric nutritional supplementation.  EXAM: PULL TROUGH GASTROSTOMY TUBE PLACEMENT  COMPARISON:  DG ABD PORTABLE 1V dated 07/02/2013  MEDICATIONS: ANCEF 2 G IV; Antibiotics were administered within 1 hour of the procedure.  Glucagon 1 g IV  CONTRAST:  40 mL of Isovue 300 administered into the gastric lumen.  ANESTHESIA/SEDATION: Versed 1 mg IV; Fentanyl 50 mcg IV  Sedation time  10 minutes  FLUOROSCOPY TIME:  2 minutes 6 seconds.  COMPLICATIONS: None immediate  PROCEDURE: Informed written consent was obtained from the  patient's family following explanation of the procedure, risks, benefits and alternatives. A time out was performed prior to the initiation of the procedure. Ultrasound scanning was performed to demarcate the edge of the left lobe of the liver. Maximal barrier sterile technique utilized including caps, mask, sterile gowns, sterile gloves, large sterile drape, hand hygiene and Betadine prep.  The left upper quadrant was sterilely prepped and draped. An oral gastric catheter was inserted into the stomach under fluoroscopy. The existing nasogastric feeding tube was removed. The left costal margin and barium opacified transverse colon were identified and avoided. Air was injected into the stomach for insufflation and visualization under fluoroscopy. Under sterile conditions a 17 gauge trocar needle was utilized to access the stomach percutaneously beneath the left subcostal margin after the overlying soft tissues were anesthetized with 1% Lidocaine with epinephrine. Needle position was confirmed within the stomach with aspiration of air and injection of small amount of contrast. A single T tack was deployed for gastropexy. Over an Amplatz guide wire, a 9-French sheath was inserted into the stomach. A snare device was utilized to capture the oral gastric catheter. The snare device was pulled retrograde from the stomach up the esophagus and out the oropharynx. The 20-French pull-through gastrostomy was connected to the snare device and pulled antegrade through the oropharynx down the esophagus into the stomach and then through the percutaneous tract external to the patient. The gastrostomy was assembled externally. Contrast injection confirms position in the stomach. Several spot radiographic images were obtained in various obliquities for documentation. The patient tolerated procedure well without  immediate post procedural complication.  FINDINGS: After successful fluoroscopic guided placement, the gastrostomy tube is  appropriately positioned with internal disc against the ventral aspect of the gastric lumen.  IMPRESSION: Successful fluoroscopic insertion of a 20-French pull-through gastrostomy tube.  The gastrostomy may be used immediately for medication administration and in 24 hrs for the initiation of feeds.   Electronically Signed   By: Simonne Come M.D.   On: 07/02/2013 11:17   Dg Chest Port 1 View  07/01/2013   CLINICAL DATA:  CHF  EXAM: PORTABLE CHEST - 1 VIEW  COMPARISON:  DG CHEST 1V PORT dated 06/28/2013  FINDINGS: Cardiac silhouette within the upper limits normal atherosclerotic calcifications appreciated within the aorta. Tracheostomy is identified tip of the level clavicles. NG tube appreciated tip not viewed on the study. A left sided central venous catheter is appreciated tip projecting in the regions superior vena cava. Stable bilateral pulmonary opacities appreciated with areas of greatest confluence of the lung bases. There is blunting of the costophrenic angles also stable. No acute osseous abnormalities. There is no evidence of pneumothorax.  IMPRESSION: No significant change when chest radiograph. Stable bilateral pulmonary opacities and small pleural effusions.   Electronically Signed   By: Salome Holmes M.D.   On: 07/01/2013 08:09   Dg Abd Portable 1v  07/02/2013   CLINICAL DATA:  Check barium for possible gastrostomy placement  EXAM: PORTABLE ABDOMEN - 1 VIEW  COMPARISON:  07/01/2013  FINDINGS: Barium is noted throughout the colon delineating the transverse: . The appendix is unremarkable. A nasogastric catheter is seen within the stomach. No acute abnormality is noted.   Electronically Signed   By: Alcide Clever M.D.   On: 07/02/2013 07:22   Dg Abd Portable 1v  07/01/2013   CLINICAL DATA:  Evaluate barium prior to gastrostomy  EXAM: PORTABLE ABDOMEN - 1 VIEW  COMPARISON:  For any 2015  FINDINGS: NG tube within the stomach. Large amount of barium within the proximal stomach. Barium contrast noted  throughout the small bowel diffusely as well as into the cecum and right colon. Negative for obstruction. Small left effusion noted with left base collapse/consolidation.  IMPRESSION: Barium contrast remains within the stomach and small bowel. Negative for obstruction.   Electronically Signed   By: Ruel Favors M.D.   On: 07/01/2013 08:02   ASSESSMENT: Acute respiratory failure Acute on chronic systolic heart failure Pulmonary edema HCAP Suspectd COPD without evidence of exacerbation Possible CVA  Acute encephalopathy / delirium Psychiatric history? S/p PEG Placement 4/10  PLAN: Continue Depakote  Bronchodilators Inhaled steroids, no indication for systemic Antibiotics per primary team Family would not want prolonged care, would consider palliation if weaning is unsuccessful  PSV as tolerated Keep on slow weaning protocol select Trach sutures can be removed 4/13   Canary Brim, NP-C [facility] Pgr: [phone] or [phone]  Levy Pupa, MD, PhD 07/03/2013, 2:50 PM  Pulmonary and Critical Care [phone] or if no answer [phone]

## 2013-07-02 NOTE — Sedation Documentation (Signed)
Ancef 2g IV given per order. [facility]

## 2013-07-02 NOTE — Procedures (Signed)
Successful fluoroscopic guided insertion of gastrostomy tube without immediate post procedural complicatoin.   The gastrostomy tube may be used immediately for medications.  Otherwise, place gastrostomy tube to low wall suction for 24 hrs.  Tube feeds may be initiated in 24 hours as per the primary team.   

## 2013-07-04 ENCOUNTER — Other Ambulatory Visit (HOSPITAL_COMMUNITY): Payer: Self-pay

## 2013-07-04 LAB — BASIC METABOLIC PANEL
BUN: 48 mg/dL — ABNORMAL HIGH (ref 6–23)
CHLORIDE: 94 meq/L — AB (ref 96–112)
CO2: 23 mEq/L (ref 19–32)
Calcium: 8.6 mg/dL (ref 8.4–10.5)
Creatinine, Ser: 2.54 mg/dL — ABNORMAL HIGH (ref 0.50–1.10)
GFR calc non Af Amer: 18 mL/min — ABNORMAL LOW (ref >90)
GFR, EST AFRICAN AMERICAN: 21 mL/min — AB (ref >90)
Glucose, Bld: 160 mg/dL — ABNORMAL HIGH (ref 70–99)
POTASSIUM: 4.6 meq/L (ref 3.7–5.3)
Sodium: 132 mEq/L — ABNORMAL LOW (ref 137–147)

## 2013-07-04 LAB — CBC
HCT: 22.6 % — ABNORMAL LOW (ref 36.0–46.0)
HEMOGLOBIN: 7.3 g/dL — AB (ref 12.0–15.0)
MCH: 27.9 pg (ref 26.0–34.0)
MCHC: 32.3 g/dL (ref 30.0–36.0)
MCV: 86.3 fL (ref 78.0–100.0)
Platelets: 287 10*3/uL (ref 150–400)
RBC: 2.62 MIL/uL — AB (ref 3.87–5.11)
RDW: 15 % (ref 11.5–15.5)
WBC: 15.8 10*3/uL — AB (ref 4.0–10.5)

## 2013-07-05 DIAGNOSIS — J90 Pleural effusion, not elsewhere classified: Secondary | ICD-10-CM

## 2013-07-05 DIAGNOSIS — Z930 Tracheostomy status: Secondary | ICD-10-CM

## 2013-07-05 DIAGNOSIS — I369 Nonrheumatic tricuspid valve disorder, unspecified: Secondary | ICD-10-CM

## 2013-07-05 LAB — BASIC METABOLIC PANEL
BUN: 53 mg/dL — ABNORMAL HIGH (ref 6–23)
CALCIUM: 8.4 mg/dL (ref 8.4–10.5)
CO2: 24 mEq/L (ref 19–32)
Chloride: 93 mEq/L — ABNORMAL LOW (ref 96–112)
Creatinine, Ser: 2.37 mg/dL — ABNORMAL HIGH (ref 0.50–1.10)
GFR, EST AFRICAN AMERICAN: 23 mL/min — AB (ref >90)
GFR, EST NON AFRICAN AMERICAN: 19 mL/min — AB (ref >90)
Glucose, Bld: 169 mg/dL — ABNORMAL HIGH (ref 70–99)
Potassium: 4.9 mEq/L (ref 3.7–5.3)
SODIUM: 132 meq/L — AB (ref 137–147)

## 2013-07-05 LAB — BLOOD GAS, ARTERIAL
ACID-BASE EXCESS: 0.3 mmol/L (ref 0.0–2.0)
Bicarbonate: 24.5 mEq/L — ABNORMAL HIGH (ref 20.0–24.0)
FIO2: 0.3 %
O2 Saturation: 99.4 %
PEEP: 5 cmH2O
Patient temperature: 99
RATE: 18 resp/min
TCO2: 25.7 mmol/L (ref 0–100)
VT: 450 mL
pCO2 arterial: 40.6 mmHg (ref 35.0–45.0)
pH, Arterial: 7.398 (ref 7.350–7.450)
pO2, Arterial: 147 mmHg — ABNORMAL HIGH (ref 80.0–100.0)

## 2013-07-05 LAB — PREPARE RBC (CROSSMATCH)

## 2013-07-05 LAB — CBC
HCT: 20.7 % — ABNORMAL LOW (ref 36.0–46.0)
Hemoglobin: 6.6 g/dL — CL (ref 12.0–15.0)
MCH: 27.5 pg (ref 26.0–34.0)
MCHC: 31.9 g/dL (ref 30.0–36.0)
MCV: 86.3 fL (ref 78.0–100.0)
PLATELETS: 265 10*3/uL (ref 150–400)
RBC: 2.4 MIL/uL — AB (ref 3.87–5.11)
RDW: 15 % (ref 11.5–15.5)
WBC: 13.4 10*3/uL — AB (ref 4.0–10.5)

## 2013-07-05 LAB — ABO/RH: ABO/RH(D): A POS

## 2013-07-05 NOTE — Progress Notes (Signed)
PULMONARY / CRITICAL CARE MEDICINE   Name: Deborah Graves MRN: [id number] DOB: 1941-01-05    CONSULTATION DATE:  06/21/2013   CHIEF COMPLAINT:  Acute respiratory failure  BRIEF PATIENT DESCRIPTION:  73 y/o female admitted to outside hospital with COPD, pneumonia, sepsis, Staph hominas bacteremia and hypercarbic respiratory failure requiring intubation.  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    EVENTS  4/13 - weaning on PSV 12/5, 30%, hgb low, pt to receive transfusion    SUBJECTIVE: RT reports pt to wean 8 hours  PHYSICAL EXAMINATION: General:  Chronically ill appearing white female on vent Neuro:  left side weak HEENT:  Trach cdi Cardiovascular:  rrr Lungs: diminished breath sounds Abdomen:  Soft, obese, positive bowel sounds  Musculoskeletal:  Generalized edema  Skin:  Scattered area of ecchymosis   LABS:  Recent Labs Lab 07/02/13 0500 07/04/13 0500 07/05/13 0605  NA 134* 132* 132*  K 4.5 4.6 4.9  CL 95* 94* 93*  CO2 25 23 24   BUN 49* 48* 53*  CREATININE 2.62* 2.54* 2.37*  GLUCOSE 183* 160* 169*    Recent Labs Lab 07/02/13 0500 07/04/13 0500 07/05/13 0605  HGB 7.3* 7.3* 6.6*  HCT 22.3* 22.6* 20.7*  WBC 8.1 15.8* 13.4*  PLT 295 287 265   IMAGING: Ct Chest Wo Contrast  07/04/2013   CLINICAL DATA:  Ventilated patient, evaluate for pleural effusions  EXAM: CT CHEST WITHOUT CONTRAST  TECHNIQUE: Multidetector CT imaging of the chest was performed following the standard protocol without IV contrast.  COMPARISON:  DG CHEST 1V PORT dated 07/04/2013; DG CHEST 1V PORT dated 07/01/2013; DG CHEST 1V PORT dated 06/28/2013  FINDINGS: Tracheostomy tube identified within the trachea. Left subclavian central line tip extends just into the superior vena cava. There is calcification of the aortic arch and there is coronary arterial calcification as well as calcification at the  root of the aorta.  There is minimal pericardial thickening/ effusion. There is a moderately large left pleural effusion and a moderately large right pleural effusion. As a result of the effusions, there is significant passive atelectasis bilaterally. The aerated portions of the lungs are clear.  Incidentally detected is pneumoperitoneum. This is seen over the anterior aspect of the liver.  IMPRESSION: 1. Moderately large bilateral pleural effusions, with passive atelectasis.  2. Pneumoperitoneum, of uncertain volume, seen incidentally on the inferior edge of the scan volume. I spoke with Karena AddisonIrene, the patient's nurse, and learned that the patient had a PEG tube placed on 07/02/13, which, in the absence of acute abdominal symptoms, likely accounts for this finding.   Electronically Signed   By: Esperanza Heiraymond  Rubner M.D.   On: 07/04/2013 21:06   Dg Chest Port 1 View  07/04/2013   CLINICAL DATA:  Congestive heart failure.  EXAM: PORTABLE CHEST - 1 VIEW  COMPARISON:  July 01, 2013.  FINDINGS: Stable cardiomegaly. Tracheostomy tube and left subclavian catheter unchanged in position. No pneumothorax is seen. Stable bilateral pleural effusions are noted compared to prior exam. Underlying pneumonia or atelectasis cannot be excluded.  IMPRESSION: Stable cardiomegaly and bilateral pleural effusions. Stable support apparatus.   Electronically Signed   By: Roque LiasJames  Green M.D.   On: 07/04/2013 09:32   ASSESSMENT: Acute respiratory failure Acute on chronic systolic heart failure Pulmonary edema HCAP Suspectd COPD without evidence of exacerbation Possible CVA  Acute encephalopathy / delirium Psychiatric history? S/p PEG Placement 4/10  PLAN: Continue Depakote  Bronchodilators Inhaled steroids, no indication for systemic Antibiotics per primary team Family would not want prolonged care, would consider palliation if weaning is unsuccessful.  Has been very slow thus far.  May need to have further discussions with family   PSV as tolerated Keep on slow weaning protocol select Trach sutures can be removed 4/13   Canary BrimBrandi Felipe Cabell, NP-C [facility] Pgr: [phone] or [phone]

## 2013-07-05 NOTE — Progress Notes (Signed)
  Echocardiogram 2D Echocardiogram has been performed.  Deborah Graves 07/05/2013, 3:13 PM

## 2013-07-05 NOTE — Progress Notes (Signed)
[facility]                                                                                              Progress note     Patient Demographics  Barbara CowerMartha Arlotta, is a 73 y.o. female  ZOX:[id number]  [id number]:[id number]  DOB - 1940/04/21  Admit date - 06/18/2013  Admitting Physician Elnora MorrisonAhmad B Barakat, MD  Outpatient Primary MD for the patient is No primary provider on file.  LOS - 17   Chief complaint   Respiratory failure   Anxiety         Subjective:   Unable to obtain due to sedation, nausea and vomiting he reported that night subcutaneous feed was held  Objective:   Vital signs  Temperature 99  Heart rate 82 Respiratory rate  17 Blood pressure  178/76 Pulse ox   100%    Exam Sedated, No new F.N deficits, agitated at times [place].AT,  moist mucous membranes, ET tube in place Supple Neck,No JVD, No cervical lymphadenopathy appriciated.  Tracheostomy midline Symmetrical Chest wall movement, diminished with scattered rhonchi and wheezing RRR,No Gallops,Rubs or new Murmurs, No Parasternal Heave +ve B.Sounds, Abd Soft, Non tender, No organomegaly appriciated, No rebound - guarding or rigidity. No Cyanosis, Clubbing with +1 edema bilaterally   I&Os  2490/800 Tracheostomy tube #6 Shiley placed on 4/3  Data Review   CBC  Recent Labs Lab 06/30/13 0500 07/01/13 0515 07/02/13 0500 07/04/13 0500 07/05/13 0605  WBC 12.0* 10.7* 8.1 15.8* 13.4*  HGB 8.0* 8.2* 7.3* 7.3* 6.6*  HCT 25.0* 25.9* 22.3* 22.6* 20.7*  PLT 285 367 295 287 265  MCV 86.2 86.6 85.1 86.3 86.3  MCH 27.6 27.4 27.9 27.9 27.5  MCHC 32.0 31.7 32.7 32.3 31.9  RDW 14.4 14.3 14.3 15.0 15.0  LYMPHSABS  --   --  0.6*  --   --   MONOABS  --   --  0.8  --   --   EOSABS  --   --  0.4  --   --   BASOSABS  --   --  0.1  --   --     Chemistries   Recent Labs Lab 06/30/13 0418 07/02/13 0500 07/04/13 0500 07/05/13 0605   NA 138 134* 132* 132*  K 4.3 4.5 4.6 4.9  CL 99 95* 94* 93*  CO2 22 25 23 24   GLUCOSE 102* 183* 160* 169*  BUN 47* 49* 48* 53*  CREATININE 2.92* 2.62* 2.54* 2.37*  CALCIUM 8.9 8.5 8.6 8.4     Cardiac Enzymes No results found for this basename: CK, CKMB, TROPONINI, MYOGLOBIN,  in the last 168 hours ------------------------------------------------------------------------------------------------------------------ No components found with this basename: POCBNP,   Micro Results No results found for this or any previous visit (from the past 240 hour(s)).   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 Slow weaning probably due to to large pleural effusions Line sepsis/SIRS with staph hominis  cultured in transferring hospital treated History of bilateral pneumonia; off IV antibiotics Mild non-ST elevation MI at transferring hospital, continue with statin COPD continue with nebulizer treatment Upper GI bleed with gastroccult positive in transferring hospital off aspirin for now Congestive heart failure; mixed systolic and diastolic with ejection fraction 38 percent;   Diabetes mellitus type 2 continue Levemir and insulin sliding scale CVA continue with statin off aspirin due to GI bleed Hypothyroidism normal studies Anxiety disorder on high doses off Versed and fentanyl  History of Noncompliance Protein calorie malnutrition on Nepro at 39 cc per hour Acute on chronic kidney injury off ACE inhibitor and Lasix,  Hyperkalemia resolved Hypertension; still elevated Encephalopathy off Versed drip Bilateral large pleural effusions  Plan  Right lung thoracentesis by IR  Blood transfusion x2 units Removed tracheostomy  sutures today  Critical care time 35 minutes  Code Status: Full  DVT Prophylaxis  heparin   Carron CurieAli Benjaman Artman  M.D on 07/05/2013 at 11:38 AM

## 2013-07-06 ENCOUNTER — Other Ambulatory Visit (HOSPITAL_COMMUNITY): Payer: Self-pay

## 2013-07-06 LAB — BASIC METABOLIC PANEL
BUN: 60 mg/dL — ABNORMAL HIGH (ref 6–23)
CALCIUM: 8.6 mg/dL (ref 8.4–10.5)
CO2: 24 mEq/L (ref 19–32)
Chloride: 93 mEq/L — ABNORMAL LOW (ref 96–112)
Creatinine, Ser: 2.22 mg/dL — ABNORMAL HIGH (ref 0.50–1.10)
GFR calc Af Amer: 24 mL/min — ABNORMAL LOW (ref >90)
GFR, EST NON AFRICAN AMERICAN: 21 mL/min — AB (ref >90)
Glucose, Bld: 151 mg/dL — ABNORMAL HIGH (ref 70–99)
Potassium: 5.5 mEq/L — ABNORMAL HIGH (ref 3.7–5.3)
SODIUM: 131 meq/L — AB (ref 137–147)

## 2013-07-06 LAB — CBC
HCT: 29.7 % — ABNORMAL LOW (ref 36.0–46.0)
Hemoglobin: 9.9 g/dL — ABNORMAL LOW (ref 12.0–15.0)
MCH: 28.7 pg (ref 26.0–34.0)
MCHC: 33.3 g/dL (ref 30.0–36.0)
MCV: 86.1 fL (ref 78.0–100.0)
Platelets: 274 10*3/uL (ref 150–400)
RBC: 3.45 MIL/uL — ABNORMAL LOW (ref 3.87–5.11)
RDW: 14.9 % (ref 11.5–15.5)
WBC: 15 10*3/uL — ABNORMAL HIGH (ref 4.0–10.5)

## 2013-07-06 LAB — TYPE AND SCREEN
ABO/RH(D): A POS
ANTIBODY SCREEN: NEGATIVE
Unit division: 0
Unit division: 0

## 2013-07-06 LAB — PROTIME-INR
INR: 1.26 (ref 0.00–1.49)
Prothrombin Time: 15.5 seconds — ABNORMAL HIGH (ref 11.6–15.2)

## 2013-07-06 NOTE — Procedures (Signed)
Successful US guided left thoracentesis. Yielded 450mL of clear yellow fluid. Pt tolerated procedure well. No immediate complications.  Specimen was not sent for labs. CXR ordered.  Brayton ElKevin Kristy Catoe PA-C 07/06/2013 4:39 PM

## 2013-07-06 NOTE — Progress Notes (Signed)
[facility]                                                                                              Progress note     Patient Demographics  Deborah Graves, is a 73 y.o. female  BMW:[id number]  UVO:[id number]  DOB - 1940-07-08  Admit date - 06/18/2013  Admitting Physician Elnora MorrisonAhmad B Barakat, MD  Outpatient Primary MD for the patient is No primary provider on file.  LOS - 18   Chief complaint   Respiratory failure   Anxiety         Subjective:   Unable to obtain due to sedation,   Objective:   Vital signs  Temperature 99.5 Heart rate 80 Respiratory rate  18 Blood pressure  154/72 Pulse ox   97%    Exam Sedated, No new F.N deficits, agitated at times [place].AT,  moist mucous membranes, ET tube in place Supple Neck,No JVD, No cervical lymphadenopathy appriciated.  Tracheostomy midline Symmetrical Chest wall movement, diminished with scattered rhonchi and wheezing RRR,No Gallops,Rubs or new Murmurs, No Parasternal Heave +ve B.Sounds, Abd Soft, Non tender, No organomegaly appriciated, No rebound - guarding or rigidity. No Cyanosis, Clubbing with +1 edema bilaterally   I&Os  3026/800 Tracheostomy tube #6 Shiley placed on 4/3  Data Review   CBC  Recent Labs Lab 07/01/13 0515 07/02/13 0500 07/04/13 0500 07/05/13 0605 07/06/13 0500  WBC 10.7* 8.1 15.8* 13.4* 15.0*  HGB 8.2* 7.3* 7.3* 6.6* 9.9*  HCT 25.9* 22.3* 22.6* 20.7* 29.7*  PLT 367 295 287 265 274  MCV 86.6 85.1 86.3 86.3 86.1  MCH 27.4 27.9 27.9 27.5 28.7  MCHC 31.7 32.7 32.3 31.9 33.3  RDW 14.3 14.3 15.0 15.0 14.9  LYMPHSABS  --  0.6*  --   --   --   MONOABS  --  0.8  --   --   --   EOSABS  --  0.4  --   --   --   BASOSABS  --  0.1  --   --   --     Chemistries   Recent Labs Lab 06/30/13 0418 07/02/13 0500 07/04/13 0500 07/05/13 0605 07/06/13 0500  NA 138 134* 132* 132* 131*  K 4.3 4.5 4.6 4.9 5.5*   CL 99 95* 94* 93* 93*  CO2 22 25 23 24 24   GLUCOSE 102* 183* 160* 169* 151*  BUN 47* 49* 48* 53* 60*  CREATININE 2.92* 2.62* 2.54* 2.37* 2.22*  CALCIUM 8.9 8.5 8.6 8.4 8.6     Cardiac Enzymes No results found for this basename: CK, CKMB, TROPONINI, MYOGLOBIN,  in the last 168 hours ------------------------------------------------------------------------------------------------------------------ No components found with this basename: POCBNP,   Micro Results Recent Results (from the past 240 hour(s))  CULTURE, BLOOD (ROUTINE X 2)     Status: None   Collection Time    07/04/13  7:10 PM      Result Value Ref Range Status   Specimen Description BLOOD LEFT HAND   Final   Special Requests BOTTLES DRAWN AEROBIC ONLY 5CC   Final   Culture  Setup Time  Final   Value: 07/05/2013 02:26     Performed at [facility]   Culture     Final   Value:        BLOOD CULTURE RECEIVED NO GROWTH TO DATE CULTURE WILL BE HELD FOR 5 DAYS BEFORE ISSUING A FINAL NEGATIVE REPORT     Performed at [facility]   Report Status PENDING   Incomplete  CULTURE, BLOOD (ROUTINE X 2)     Status: None   Collection Time    07/04/13  7:20 PM      Result Value Ref Range Status   Specimen Description BLOOD LEFT HAND   Final   Special Requests BOTTLES DRAWN AEROBIC ONLY 5CC   Final   Culture  Setup Time     Final   Value: 07/05/2013 02:26     Performed at [facility]   Culture     Final   Value:        BLOOD CULTURE RECEIVED NO GROWTH TO DATE CULTURE WILL BE HELD FOR 5 DAYS BEFORE ISSUING A FINAL NEGATIVE REPORT     Performed at [facility]   Report Status PENDING   Incomplete       Assessment & Plan    VDRF , 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, completed stage II PSV yesterday. Slow weaning         probably due to to large pleural effusions . Line sepsis/SIRS with staph hominis cultured in transferring hospital treated History of bilateral pneumonia;  off IV antibiotics Mild non-ST elevation MI at transferring hospital, continue with statin COPD continue with nebulizer treatment Upper GI bleed with gastroccult positive in transferring hospital off aspirin for now Congestive heart failure; mixed systolic and diastolic with ejection fraction 38 percent;   Diabetes mellitus type 2 continue Levemir and insulin sliding scale CVA continue with statin off aspirin due to GI bleed Hypothyroidism normal studies Anxiety disorder on high doses off Versed and fentanyl  History of Noncompliance Protein calorie malnutrition on Nepro at 39 cc per hour Acute on chronic kidney injury off ACE inhibitor and Lasix,  Hyperkalemia resolved Hypertension; still elevated Encephalopathy off Versed drip Bilateral large pleural effusions  Plan  Right lung thoracentesis by IR  Kayexalate PCXR in AM    Code Status: Full  DVT Prophylaxis  heparin   Carron CurieAli Liel Rudden M.D on 07/06/2013 at 11:02 AM

## 2013-07-07 ENCOUNTER — Other Ambulatory Visit (HOSPITAL_COMMUNITY): Payer: Self-pay

## 2013-07-07 NOTE — Procedures (Signed)
Successful US guided right thoracentesis. Yielded 450mL of clear yellow fluid. Pt tolerated procedure well. No immediate complications.  Specimen was not sent for labs. CXR ordered.  Brayton ElKevin Dalan Cowger PA-C 07/07/2013 4:05 PM

## 2013-07-07 NOTE — Progress Notes (Signed)
[facility]                                                                                              Progress note     Patient Demographics  Barbara CowerMartha Froning, is a 73 y.o. female  ZOX:[id number]  WJX:[id number]  DOB - [birth year]  Admit date - 06/18/2013  Admitting Physician Elnora MorrisonAhmad B Barakat, MD  Outpatient Primary MD for the patient is No primary provider on file.  LOS - 19   Chief complaint   Respiratory failure   Anxiety         Subjective:   Unable to obtain due to sedation,   Objective:   Vital signs  Temperature 100.5 Heart rate 88 Respiratory rate  18 Blood pressure  128/52 Pulse ox   91%    Exam Sedated, No new F.N deficits, agitated at times [place].AT,  moist mucous membranes, ET tube in place Supple Neck,No JVD, No cervical lymphadenopathy appriciated.  Tracheostomy midline Symmetrical Chest wall movement, diminished at bases right more than left. with scattered rhonchi and wheezing RRR,No Gallops,Rubs or new Murmurs, No Parasternal Heave +ve B.Sounds, Abd Soft, Non tender, No organomegaly appriciated, No rebound - guarding or rigidity. No Cyanosis, Clubbing with +1 edema bilaterally   I&Os 2046/1890 Tracheostomy tube #6 Shiley placed on 4/3  Data Review   CBC  Recent Labs Lab 07/01/13 0515 07/02/13 0500 07/04/13 0500 07/05/13 0605 07/06/13 0500  WBC 10.7* 8.1 15.8* 13.4* 15.0*  HGB 8.2* 7.3* 7.3* 6.6* 9.9*  HCT 25.9* 22.3* 22.6* 20.7* 29.7*  PLT 367 295 287 265 274  MCV 86.6 85.1 86.3 86.3 86.1  MCH 27.4 27.9 27.9 27.5 28.7  MCHC 31.7 32.7 32.3 31.9 33.3  RDW 14.3 14.3 15.0 15.0 14.9  LYMPHSABS  --  0.6*  --   --   --   MONOABS  --  0.8  --   --   --   EOSABS  --  0.4  --   --   --   BASOSABS  --  0.1  --   --   --     Chemistries   Recent Labs Lab 07/02/13 0500 07/04/13 0500 07/05/13 0605 07/06/13 0500  NA 134* 132* 132* 131*  K 4.5 4.6  4.9 5.5*  CL 95* 94* 93* 93*  CO2 25 23 24 24   GLUCOSE 183* 160* 169* 151*  BUN 49* 48* 53* 60*  CREATININE 2.62* 2.54* 2.37* 2.22*  CALCIUM 8.5 8.6 8.4 8.6     Cardiac Enzymes No results found for this basename: CK, CKMB, TROPONINI, MYOGLOBIN,  in the last 168 hours ------------------------------------------------------------------------------------------------------------------ No components found with this basename: POCBNP,   Micro Results Recent Results (from the past 240 hour(s))  CULTURE, BLOOD (ROUTINE X 2)     Status: None   Collection Time    07/04/13  7:10 PM      Result Value Ref Range Status   Specimen Description BLOOD LEFT HAND   Final   Special Requests BOTTLES DRAWN AEROBIC ONLY 5CC   Final   Culture  Setup Time     Final  Value: 07/05/2013 02:26     Performed at [facility]   Culture     Final   Value:        BLOOD CULTURE RECEIVED NO GROWTH TO DATE CULTURE WILL BE HELD FOR 5 DAYS BEFORE ISSUING A FINAL NEGATIVE REPORT     Performed at [facility]   Report Status PENDING   Incomplete  CULTURE, BLOOD (ROUTINE X 2)     Status: None   Collection Time    07/04/13  7:20 PM      Result Value Ref Range Status   Specimen Description BLOOD LEFT HAND   Final   Special Requests BOTTLES DRAWN AEROBIC ONLY 5CC   Final   Culture  Setup Time     Final   Value: 07/05/2013 02:26     Performed at [facility]   Culture     Final   Value:        BLOOD CULTURE RECEIVED NO GROWTH TO DATE CULTURE WILL BE HELD FOR 5 DAYS BEFORE ISSUING A FINAL NEGATIVE REPORT     Performed at [facility]   Report Status PENDING   Incomplete       Assessment & Plan    VDRF , 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, continue with e PSV  weaning. Status post left      thoracentesis, done yesterday Line sepsis/SIRS with staph hominis cultured in transferring hospital treated History of bilateral pneumonia; off IV antibiotics Mild non-ST  elevation MI at transferring hospital, continue with statin COPD continue with nebulizer treatment Upper GI bleed with gastroccult positive in transferring hospital off aspirin for now Congestive heart failure; mixed systolic and diastolic with ejection fraction 38 percent;   Diabetes mellitus type 2 continue Levemir and insulin sliding scale CVA continue with statin off aspirin due to GI bleed Hypothyroidism normal studies Anxiety disorder on high doses off Versed and fentanyl  History of Noncompliance Protein calorie malnutrition on Nepro at 39 cc per hour Acute on chronic kidney injury off ACE inhibitor and Lasix,  Hyperkalemia resolved Hypertension; still elevated Encephalopathy off Versed drip Bilateral large pleural effusions status post left lung thoracentesis. 450 mL fluid removed.  Plan  Right lung thoracentesis by IR today or tomorrow Continue weaning trials per protocol Check CBC BMP in a.m.   Code Status: Full  DVT Prophylaxis  heparin   Carron CurieAli Ron Junco M.D on 07/07/2013 at 1:14 PM

## 2013-07-08 ENCOUNTER — Other Ambulatory Visit (HOSPITAL_COMMUNITY): Payer: Self-pay

## 2013-07-08 DIAGNOSIS — Z930 Tracheostomy status: Secondary | ICD-10-CM

## 2013-07-08 LAB — BASIC METABOLIC PANEL
BUN: 62 mg/dL — AB (ref 6–23)
CALCIUM: 8.9 mg/dL (ref 8.4–10.5)
CO2: 22 mEq/L (ref 19–32)
Chloride: 100 mEq/L (ref 96–112)
Creatinine, Ser: 1.92 mg/dL — ABNORMAL HIGH (ref 0.50–1.10)
GFR calc Af Amer: 29 mL/min — ABNORMAL LOW (ref >90)
GFR, EST NON AFRICAN AMERICAN: 25 mL/min — AB (ref >90)
Glucose, Bld: 89 mg/dL (ref 70–99)
Potassium: 5.9 mEq/L — ABNORMAL HIGH (ref 3.7–5.3)
Sodium: 137 mEq/L (ref 137–147)

## 2013-07-08 LAB — CBC WITH DIFFERENTIAL/PLATELET
BASOS ABS: 0.2 10*3/uL — AB (ref 0.0–0.1)
Basophils Relative: 1 % (ref 0–1)
EOS ABS: 0.7 10*3/uL (ref 0.0–0.7)
Eosinophils Relative: 4 % (ref 0–5)
HCT: 31.8 % — ABNORMAL LOW (ref 36.0–46.0)
Hemoglobin: 10 g/dL — ABNORMAL LOW (ref 12.0–15.0)
Lymphocytes Relative: 19 % (ref 12–46)
Lymphs Abs: 3.1 10*3/uL (ref 0.7–4.0)
MCH: 27.9 pg (ref 26.0–34.0)
MCHC: 31.4 g/dL (ref 30.0–36.0)
MCV: 88.6 fL (ref 78.0–100.0)
Monocytes Absolute: 1.5 10*3/uL — ABNORMAL HIGH (ref 0.1–1.0)
Monocytes Relative: 9 % (ref 3–12)
NEUTROS ABS: 10.8 10*3/uL — AB (ref 1.7–7.7)
NEUTROS PCT: 67 % (ref 43–77)
Platelets: 321 10*3/uL (ref 150–400)
RBC: 3.59 MIL/uL — ABNORMAL LOW (ref 3.87–5.11)
RDW: 14.9 % (ref 11.5–15.5)
WBC: 16.3 10*3/uL — ABNORMAL HIGH (ref 4.0–10.5)

## 2013-07-08 LAB — URINE MICROSCOPIC-ADD ON

## 2013-07-08 LAB — URINALYSIS, ROUTINE W REFLEX MICROSCOPIC
BILIRUBIN URINE: NEGATIVE
GLUCOSE, UA: NEGATIVE mg/dL
Hgb urine dipstick: NEGATIVE
KETONES UR: NEGATIVE mg/dL
Leukocytes, UA: NEGATIVE
Nitrite: NEGATIVE
Protein, ur: 30 mg/dL — AB
Specific Gravity, Urine: 1.013 (ref 1.005–1.030)
Urobilinogen, UA: 0.2 mg/dL (ref 0.0–1.0)
pH: 5 (ref 5.0–8.0)

## 2013-07-08 LAB — PROTIME-INR
INR: 1.24 (ref 0.00–1.49)
Prothrombin Time: 15.3 seconds — ABNORMAL HIGH (ref 11.6–15.2)

## 2013-07-08 NOTE — Progress Notes (Signed)
PULMONARY / CRITICAL CARE MEDICINE   Name: Barbara CowerMartha Cortese MRN: [id number] DOB: Aug 21, 1940    CONSULTATION DATE:  06/21/2013   CHIEF COMPLAINT:  Acute respiratory failure  BRIEF PATIENT DESCRIPTION:  73 y/o female admitted to outside hospital with COPD, pneumonia, sepsis, Staph hominas bacteremia and hypercarbic respiratory failure requiring intubation.  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    EVENTS  4/13 - weaning on PSV 12/5, 30%, hgb low, pt to receive transfusion  4/15 rt thora for 450 cc per IR  SUBJECTIVE: RT reports pt to wean 12 hours  PHYSICAL EXAMINATION: General:  Chronically ill appearing white female on vent Neuro:  left side weak HEENT:  Trach cdi Cardiovascular:  rrr Lungs: diminished breath sounds Abdomen:  Soft, obese, positive bowel sounds , peg in palce Musculoskeletal:  Generalized edema  Skin:  Scattered area of ecchymosis   LABS:  Recent Labs Lab 07/05/13 0605 07/06/13 0500 07/08/13 0445  NA 132* 131* 137  K 4.9 5.5* 5.9*  CL 93* 93* 100  CO2 24 24 22   BUN 53* 60* 62*  CREATININE 2.37* 2.22* 1.92*  GLUCOSE 169* 151* 89    Recent Labs Lab 07/05/13 0605 07/06/13 0500 07/08/13 0445  HGB 6.6* 9.9* 10.0*  HCT 20.7* 29.7* 31.8*  WBC 13.4* 15.0* 16.3*  PLT 265 274 321   IMAGING: Dg Chest Port 1 View  07/08/2013   CLINICAL DATA:  RF respiratory failure  EXAM: PORTABLE CHEST - 1 VIEW  COMPARISON:  DG CHEST 1V PORT dated 07/07/2013  FINDINGS: Tracheostomy to and right PICC line are unchanged. Stable mildly enlarged cardiac silhouette. There is bilateral pleural effusions unchanged. Mild central venous congestion.  IMPRESSION: 1. Stable support apparatus. 2. Stable bilateral pleural effusions and central venous congestion.   Electronically Signed   By: Genevive BiStewart  Edmunds M.D.   On: 07/08/2013 08:07   Dg Chest Port 1 View  07/07/2013   CLINICAL  DATA:  Status post right-sided thoracentesis  EXAM: PORTABLE CHEST - 1 VIEW  COMPARISON:  US THORACENTESIS ASP PLEURAL SPACE W/IMG GUIDE dated 07/07/2013; DG CHEST 1V PORT dated 07/07/2013; DG CHEST 1V PORT dated 07/07/2013  FINDINGS: Interval removal of right pleural effusion. No pneumothorax. Tracheostomy tube in unchanged position, as is right PICC line. Right lung is clear. Density at the left lung base suggesting small effusion and underlying consolidation, stable.  IMPRESSION: Stable except for right thoracentesis with no pneumothorax.   Electronically Signed   By: Esperanza Heiraymond  Rubner M.D.   On: 07/07/2013 17:31   Dg Chest Port 1 View  07/07/2013   CLINICAL DATA:  PICC line placement  EXAM: PORTABLE CHEST - 1 VIEW  COMPARISON:  Portable exam 1202 hr compared to 07/07/2013 at 0549 hr  FINDINGS: Right arm PICC line tip projects over SVC near cavoatrial junction.  Tracheostomy tube and left subclavian Port-A-Cath unchanged.  Enlargement of cardiac silhouette with pulmonary vascular congestion.  Asymmetric infiltrates greatest at right base, likely persistent asymmetric edema.  Minimal right pleural effusion.  No pneumothorax.  Bones demineralized.  IMPRESSION: Persistent mild asymmetric pulmonary edema.  Tip of right arm PICC line projects over SVC.   Electronically Signed   By: Ulyses SouthwardMark  Boles M.D.   On: 07/07/2013 12:16   Dg Chest Port 1 View  07/07/2013   CLINICAL DATA:  Respiratory to  EXAM: PORTABLE CHEST - 1 VIEW  COMPARISON:  07/06/2013  FINDINGS: Cardiac shadow  is stable. A tracheostomy catheter and left central venous line are again seen and stable. Persistent right pleural effusion is noted. Vascular congestion with mild interstitial edema is again seen.  IMPRESSION: No significant change from the prior exam.   Electronically Signed   By: Alcide CleverMark  Lukens M.D.   On: 07/07/2013 07:39   Dg Chest Port 1 View  07/06/2013   CLINICAL DATA:  Status post left thoracentesis.  EXAM: PORTABLE CHEST - 1 VIEW  COMPARISON:   CT on 07/04/2013  FINDINGS: Patient has tracheostomy in place. Left-sided Port-A-Cath tip overlies the superior vena cava.  Heart is enlarged. There is moderate right pleural effusion. No pneumothorax. Mild pulmonary edema noted. There has been significant improvement in left pleural effusion and left basilar opacity.  IMPRESSION: 1. Improved left pleural effusion and overall aeration. 2. Persistent right pleural effusion and mild edema. 3. No pneumothorax.   Electronically Signed   By: Rosalie GumsBeth  Brown M.D.   On: 07/06/2013 19:00   [place] Thoracentesis Asp Pleural Space W/img Guide  07/07/2013   CLINICAL DATA:  Respiratory failure, right-sided pleural effusion. Request therapeutic thoracentesis.  EXAM: ULTRASOUND GUIDED RIGHT THORACENTESIS  COMPARISON:  Left-sided thoracentesis performed 07/06/13  PROCEDURE: An ultrasound guided thoracentesis was thoroughly discussed with the patient and questions answered. The benefits, risks, alternatives and complications were also discussed. The patient understands and wishes to proceed with the procedure. Written consent was obtained.  Ultrasound was performed to localize and mark an adequate pocket of fluid in the right chest. The area was then prepped and draped in the normal sterile fashion. 1% Lidocaine was used for local anesthesia. Under ultrasound guidance a 19 gauge Yueh catheter was introduced. Thoracentesis was performed. The catheter was removed and a dressing applied.  Complications:  None immediate  FINDINGS: A total of approximately 450 mL of clear yellow fluid was removed. A fluid sample was notsent for laboratory analysis.  IMPRESSION: Successful ultrasound guided right thoracentesis yielding 450 mL of pleural fluid.  Read by: Brayton ElKevin Bruning PA-C   Electronically Signed   By: Malachy MoanHeath  McCullough M.D.   On: 07/07/2013 16:08   ASSESSMENT: Acute respiratory failure Acute on chronic systolic heart failure Pulmonary edema HCAP Suspectd COPD without evidence of  exacerbation Possible CVA  Acute encephalopathy / delirium Psychiatric history? S/p PEG Placement 4/10  PLAN: Continue Depakote  Bronchodilators Inhaled steroids, no indication for systemic Antibiotics per primary team Family would not want prolonged care, would consider palliation if weaning is unsuccessful.  Has been very slow thus far.  May need to have further discussions with family  PSV as tolerated, hopefully thora will help with weaning. Keep on slow weaning protocol select.   PCCM will see again on Monday 4/20   [facility] Minor ACNP [facility] P[facility] Pager [phone] till 3 pm If no answer page [phone] 07/08/2013, 8:18 AM    Billy Fischeravid Jari Dipasquale, MD ; [facility] Mobile [phone].  After 5:30 PM or weekends, call [phone]

## 2013-07-08 NOTE — Progress Notes (Addendum)
[facility]                                                                                              Progress note     Patient Demographics  Deborah Graves, is a 73 y.o. female  ZOX:[id number]  WJX:[id number]  DOB - 02-13-1941  Admit date - 06/18/2013  Admitting Physician Elnora MorrisonAhmad B Barakat, MD  Outpatient Primary MD for the patient is No primary provider on file.  LOS - 20   Chief complaint   Respiratory failure   Anxiety         Subjective:   Unable to obtain due to sedation,   Objective:   Vital signs  Temperature 98.8 Heart rate 85 Respiratory rate  18 Blood pressure  141/57 Pulse ox   98%    Exam Sedated, No new F.N deficits, agitated at times [place].AT,  moist mucous membranes, ET tube in place Supple Neck,No JVD, No cervical lymphadenopathy appriciated.  Tracheostomy midline Symmetrical Chest wall movement, diminished at bases right more than left. with scattered rhonchi and wheezing RRR,No Gallops,Rubs or new Murmurs, No Parasternal Heave +ve B.Sounds, Abd Soft, Non tender, No organomegaly appriciated, No rebound - guarding or rigidity. No Cyanosis, Clubbing with +1 edema bilaterally   I&Os 2046/1890 Tracheostomy tube #6 Shiley placed on 4/3  Data Review   CBC  Recent Labs Lab 07/02/13 0500 07/04/13 0500 07/05/13 0605 07/06/13 0500 07/08/13 0445  WBC 8.1 15.8* 13.4* 15.0* 16.3*  HGB 7.3* 7.3* 6.6* 9.9* 10.0*  HCT 22.3* 22.6* 20.7* 29.7* 31.8*  PLT 295 287 265 274 321  MCV 85.1 86.3 86.3 86.1 88.6  MCH 27.9 27.9 27.5 28.7 27.9  MCHC 32.7 32.3 31.9 33.3 31.4  RDW 14.3 15.0 15.0 14.9 14.9  LYMPHSABS 0.6*  --   --   --  3.1  MONOABS 0.8  --   --   --  1.5*  EOSABS 0.4  --   --   --  0.7  BASOSABS 0.1  --   --   --  0.2*    Chemistries   Recent Labs Lab 07/02/13 0500 07/04/13 0500 07/05/13 0605 07/06/13 0500 07/08/13 0445  NA 134* 132* 132* 131*  137  K 4.5 4.6 4.9 5.5* 5.9*  CL 95* 94* 93* 93* 100  CO2 25 23 24 24 22   GLUCOSE 183* 160* 169* 151* 89  BUN 49* 48* 53* 60* 62*  CREATININE 2.62* 2.54* 2.37* 2.22* 1.92*  CALCIUM 8.5 8.6 8.4 8.6 8.9     Cardiac Enzymes No results found for this basename: CK, CKMB, TROPONINI, MYOGLOBIN,  in the last 168 hours ------------------------------------------------------------------------------------------------------------------ No components found with this basename: POCBNP,   Micro Results Recent Results (from the past 240 hour(s))  CULTURE, BLOOD (ROUTINE X 2)     Status: None   Collection Time    07/04/13  7:10 PM      Result Value Ref Range Status   Specimen Description BLOOD LEFT HAND   Final   Special Requests BOTTLES DRAWN AEROBIC ONLY 5CC   Final   Culture  Setup Time  Final   Value: 07/05/2013 02:26     Performed at [facility]   Culture     Final   Value:        BLOOD CULTURE RECEIVED NO GROWTH TO DATE CULTURE WILL BE HELD FOR 5 DAYS BEFORE ISSUING A FINAL NEGATIVE REPORT     Performed at [facility]   Report Status PENDING   Incomplete  CULTURE, BLOOD (ROUTINE X 2)     Status: None   Collection Time    07/04/13  7:20 PM      Result Value Ref Range Status   Specimen Description BLOOD LEFT HAND   Final   Special Requests BOTTLES DRAWN AEROBIC ONLY 5CC   Final   Culture  Setup Time     Final   Value: 07/05/2013 02:26     Performed at [facility]   Culture     Final   Value:        BLOOD CULTURE RECEIVED NO GROWTH TO DATE CULTURE WILL BE HELD FOR 5 DAYS BEFORE ISSUING A FINAL NEGATIVE REPORT     Performed at [facility]   Report Status PENDING   Incomplete       Assessment & Plan    VDRF , 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, continue with e PSV  Weaning , finished 12 hours     yesterday Line sepsis/SIRS with staph hominis cultured in transferring hospital treated History of bilateral pneumonia; off IV  antibiotics Mild non-ST elevation MI at transferring hospital, continue with statin COPD continue with nebulizer treatment Upper GI bleed with gastroccult positive in transferring hospital off aspirin for now Congestive heart failure; mixed systolic and diastolic with ejection fraction 38 percent;   Diabetes mellitus type 2 continue Levemir and insulin sliding scale CVA continue with statin off aspirin due to GI bleed Hypothyroidism normal studies Anxiety disorder on high doses off Versed and fentanyl  History of Noncompliance Protein calorie malnutrition on Nepro at 35 cc per hour Acute on chronic kidney injury off ACE inhibitor and Lasix,  Hyperkalemia potassium 5.9 Hypertension; improving  Encephalopathy off Versed drip, no significant change Bilateral large pleural effusions status post bilateral thoracentesis.  Leukocytosis, no fever, chest x-ray negative for pneumonia, check urinalysis and procalcitonin Plan Kayexalate Check urine analysis Check CBC BMP and procalcitonin in a.m.  Critical care time 37 minutes Code Status: Full  DVT Prophylaxis  heparin   Carron CurieAli Zyanna Leisinger M.D on 07/08/2013 at 12:12 PM

## 2013-07-09 ENCOUNTER — Other Ambulatory Visit (HOSPITAL_COMMUNITY): Payer: Self-pay

## 2013-07-09 LAB — CBC
HEMATOCRIT: 30 % — AB (ref 36.0–46.0)
Hemoglobin: 9.5 g/dL — ABNORMAL LOW (ref 12.0–15.0)
MCH: 28.4 pg (ref 26.0–34.0)
MCHC: 31.7 g/dL (ref 30.0–36.0)
MCV: 89.8 fL (ref 78.0–100.0)
Platelets: 366 10*3/uL (ref 150–400)
RBC: 3.34 MIL/uL — ABNORMAL LOW (ref 3.87–5.11)
RDW: 15.1 % (ref 11.5–15.5)
WBC: 18.2 10*3/uL — ABNORMAL HIGH (ref 4.0–10.5)

## 2013-07-09 LAB — PROCALCITONIN: Procalcitonin: 1.48 ng/mL

## 2013-07-09 LAB — BASIC METABOLIC PANEL
BUN: 60 mg/dL — AB (ref 6–23)
CALCIUM: 9.1 mg/dL (ref 8.4–10.5)
CO2: 28 mEq/L (ref 19–32)
CREATININE: 1.79 mg/dL — AB (ref 0.50–1.10)
Chloride: 99 mEq/L (ref 96–112)
GFR calc Af Amer: 32 mL/min — ABNORMAL LOW (ref >90)
GFR, EST NON AFRICAN AMERICAN: 27 mL/min — AB (ref >90)
Glucose, Bld: 131 mg/dL — ABNORMAL HIGH (ref 70–99)
POTASSIUM: 4.3 meq/L (ref 3.7–5.3)
Sodium: 141 mEq/L (ref 137–147)

## 2013-07-09 NOTE — Progress Notes (Addendum)
[facility]                                                                                              Progress note     Patient Demographics  Deborah Graves, is a 73 y.o. female  BJY:[id number]  YQM:[id number]  DOB - [birth year]  Admit date - 06/18/2013  Admitting Physician Elnora MorrisonAhmad B Barakat, MD  Outpatient Primary MD for the patient is No primary provider on file.  LOS - 21   Chief complaint   Respiratory failure   Anxiety         Subjective:   Unable to obtain due to sedation, no report of diarrhea Objective:   Vital signs  Temperature at 98.3 Heart rate 101 Respiratory rate  25 Blood pressure  110/50 Pulse ox   98%    Exam Sedated, No new F.N deficits, agitated at times [place].AT,  moist mucous membranes, ET tube in place Supple Neck,No JVD, No cervical lymphadenopathy appriciated.  Tracheostomy midline Symmetrical Chest wall movement, diminished at bases right more than left. with scattered rhonchi and wheezing RRR,No Gallops,Rubs or new Murmurs, No Parasternal Heave +ve B.Sounds, Abd Soft, Non tender, No organomegaly appriciated, No rebound - guarding or rigidity. No Cyanosis, Clubbing with +1 edema bilaterally   I&Os 657/1500 Tracheostomy tube #6 Shiley placed on 4/3  Data Review   CBC  Recent Labs Lab 07/04/13 0500 07/05/13 0605 07/06/13 0500 07/08/13 0445 07/09/13 0600  WBC 15.8* 13.4* 15.0* 16.3* 18.2*  HGB 7.3* 6.6* 9.9* 10.0* 9.5*  HCT 22.6* 20.7* 29.7* 31.8* 30.0*  PLT 287 265 274 321 366  MCV 86.3 86.3 86.1 88.6 89.8  MCH 27.9 27.5 28.7 27.9 28.4  MCHC 32.3 31.9 33.3 31.4 31.7  RDW 15.0 15.0 14.9 14.9 15.1  LYMPHSABS  --   --   --  3.1  --   MONOABS  --   --   --  1.5*  --   EOSABS  --   --   --  0.7  --   BASOSABS  --   --   --  0.2*  --     Chemistries   Recent Labs Lab 07/04/13 0500 07/05/13 0605 07/06/13 0500 07/08/13 0445  07/09/13 0600  NA 132* 132* 131* 137 141  K 4.6 4.9 5.5* 5.9* 4.3  CL 94* 93* 93* 100 99  CO2 23 24 24 22 28   GLUCOSE 160* 169* 151* 89 131*  BUN 48* 53* 60* 62* 60*  CREATININE 2.54* 2.37* 2.22* 1.92* 1.79*  CALCIUM 8.6 8.4 8.6 8.9 9.1     Cardiac Enzymes No results found for this basename: CK, CKMB, TROPONINI, MYOGLOBIN,  in the last 168 hours ------------------------------------------------------------------------------------------------------------------ No components found with this basename: POCBNP,   Micro Results Recent Results (from the past 240 hour(s))  CULTURE, BLOOD (ROUTINE X 2)     Status: None   Collection Time    07/04/13  7:10 PM      Result Value Ref Range Status   Specimen Description BLOOD LEFT HAND   Final   Special Requests BOTTLES DRAWN AEROBIC ONLY 5CC  Final   Culture  Setup Time     Final   Value: 07/05/2013 02:26     Performed at [facility]   Culture     Final   Value:        BLOOD CULTURE RECEIVED NO GROWTH TO DATE CULTURE WILL BE HELD FOR 5 DAYS BEFORE ISSUING A FINAL NEGATIVE REPORT     Performed at [facility]   Report Status PENDING   Incomplete  CULTURE, BLOOD (ROUTINE X 2)     Status: None   Collection Time    07/04/13  7:20 PM      Result Value Ref Range Status   Specimen Description BLOOD LEFT HAND   Final   Special Requests BOTTLES DRAWN AEROBIC ONLY 5CC   Final   Culture  Setup Time     Final   Value: 07/05/2013 02:26     Performed at [facility]   Culture     Final   Value:        BLOOD CULTURE RECEIVED NO GROWTH TO DATE CULTURE WILL BE HELD FOR 5 DAYS BEFORE ISSUING A FINAL NEGATIVE REPORT     Performed at [facility]   Report Status PENDING   Incomplete       Assessment & Plan    VDRF , 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, continue with e PSV  Weaning ,  not weaning very       well, yesterday she finished only 3 hours of weaning and today had to be placed on  mechanical ventilation Line sepsis/SIRS with staph hominis cultured in transferring hospital treated History of bilateral pneumonia; off IV antibiotics Mild non-ST elevation MI at transferring hospital, continue with statin COPD continue with nebulizer treatment Upper GI bleed with gastroccult positive in transferring hospital off aspirin for now Congestive heart failure; 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  Leukocytosis, no fever, chest x-ray negative for pneumonia, check urinalysis and procalcitonin Plan Check CBC in a.m. Start vancomycin per tube Start cefepime for?  SIRS  Critical care time 36 minutes Code Status: Full  DVT Prophylaxis  heparin   Carron CurieAli Takhia Spoon M.D on 07/09/2013 at 11:46 AM

## 2013-07-10 ENCOUNTER — Other Ambulatory Visit (HOSPITAL_COMMUNITY): Payer: Self-pay

## 2013-07-10 LAB — CBC
HCT: 28.2 % — ABNORMAL LOW (ref 36.0–46.0)
Hemoglobin: 8.8 g/dL — ABNORMAL LOW (ref 12.0–15.0)
MCH: 28 pg (ref 26.0–34.0)
MCHC: 31.2 g/dL (ref 30.0–36.0)
MCV: 89.8 fL (ref 78.0–100.0)
PLATELETS: 381 10*3/uL (ref 150–400)
RBC: 3.14 MIL/uL — AB (ref 3.87–5.11)
RDW: 15.2 % (ref 11.5–15.5)
WBC: 16.8 10*3/uL — ABNORMAL HIGH (ref 4.0–10.5)

## 2013-07-10 NOTE — Progress Notes (Signed)
[facility]                                                                                              Progress note     Patient Demographics  Barbara CowerMartha Belgarde, is a 73 y.o. female  ZOX:[id number]  WJX:[id number]  DOB - 14-Mar-1941  Admit date - 06/18/2013  Admitting Physician Elnora MorrisonAhmad B Barakat, MD  Outpatient Primary MD for the patient is No primary provider on file.  LOS - 22   Chief complaint   Respiratory failure   Anxiety         Subjective:   Unable to obtain due to sedation, no report of diarrhea Objective:   Vital signs  Temperature at 99.6 Heart rate 91 Respiratory rate  18 Blood pressure  150/54 Pulse ox   97% Exam Sedated, No new F.N deficits, agitated at times [place].AT,  moist mucous membranes, ET tube in place Supple Neck,No JVD, No cervical lymphadenopathy appriciated.  Tracheostomy midline Symmetrical Chest wall movement, diminished at bases right more than left. with scattered rhonchi and wheezing RRR,No Gallops,Rubs or new Murmurs, No Parasternal Heave +ve B.Sounds, Abd Soft, Non tender, No organomegaly appriciated, No rebound - guarding or rigidity. No Cyanosis, Clubbing with +1 edema bilaterally   I&Os 1380/1050 Tracheostomy tube #6 Shiley placed on 4/3  Data Review   CBC  Recent Labs Lab 07/05/13 0605 07/06/13 0500 07/08/13 0445 07/09/13 0600 07/10/13 0800  WBC 13.4* 15.0* 16.3* 18.2* 16.8*  HGB 6.6* 9.9* 10.0* 9.5* 8.8*  HCT 20.7* 29.7* 31.8* 30.0* 28.2*  PLT 265 274 321 366 381  MCV 86.3 86.1 88.6 89.8 89.8  MCH 27.5 28.7 27.9 28.4 28.0  MCHC 31.9 33.3 31.4 31.7 31.2  RDW 15.0 14.9 14.9 15.1 15.2  LYMPHSABS  --   --  3.1  --   --   MONOABS  --   --  1.5*  --   --   EOSABS  --   --  0.7  --   --   BASOSABS  --   --  0.2*  --   --     Chemistries   Recent Labs Lab 07/04/13 0500 07/05/13 0605 07/06/13 0500 07/08/13 0445 07/09/13 0600   NA 132* 132* 131* 137 141  K 4.6 4.9 5.5* 5.9* 4.3  CL 94* 93* 93* 100 99  CO2 23 24 24 22 28   GLUCOSE 160* 169* 151* 89 131*  BUN 48* 53* 60* 62* 60*  CREATININE 2.54* 2.37* 2.22* 1.92* 1.79*  CALCIUM 8.6 8.4 8.6 8.9 9.1     Cardiac Enzymes No results found for this basename: CK, CKMB, TROPONINI, MYOGLOBIN,  in the last 168 hours ------------------------------------------------------------------------------------------------------------------ No components found with this basename: POCBNP,   Micro Results Recent Results (from the past 240 hour(s))  CULTURE, BLOOD (ROUTINE X 2)     Status: None   Collection Time    07/04/13  7:10 PM      Result Value Ref Range Status   Specimen Description BLOOD LEFT HAND   Final   Special Requests BOTTLES DRAWN AEROBIC ONLY 5CC   Final  Culture  Setup Time     Final   Value: 07/05/2013 02:26     Performed at [facility]   Culture     Final   Value:        BLOOD CULTURE RECEIVED NO GROWTH TO DATE CULTURE WILL BE HELD FOR 5 DAYS BEFORE ISSUING A FINAL NEGATIVE REPORT     Performed at [facility]   Report Status PENDING   Incomplete  CULTURE, BLOOD (ROUTINE X 2)     Status: None   Collection Time    07/04/13  7:20 PM      Result Value Ref Range Status   Specimen Description BLOOD LEFT HAND   Final   Special Requests BOTTLES DRAWN AEROBIC ONLY 5CC   Final   Culture  Setup Time     Final   Value: 07/05/2013 02:26     Performed at [facility]   Culture     Final   Value:        BLOOD CULTURE RECEIVED NO GROWTH TO DATE CULTURE WILL BE HELD FOR 5 DAYS BEFORE ISSUING A FINAL NEGATIVE REPORT     Performed at [facility]   Report Status PENDING   Incomplete       Assessment & Plan    VDRF , 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, continue with e PSV  Weaning ,  not weaning very       well,  Line sepsis/SIRS with staph hominis cultured in transferring hospital treated SIRS/leukocytosis  on cefepime Mild non-ST elevation MI at transferring hospital, continue with statin COPD continue with nebulizer treatment Upper GI bleed with gastroccult positive in transferring hospital off aspirin for now Congestive heart failure; 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  Leukocytosis, no fever, chest x-ray negative for pneumonia, check urinalysis and procalcitonin Plan Continue with medications  Code Status: Full  DVT Prophylaxis  heparin   Carron CurieAli Shealynn Saulnier M.D on 07/10/2013 at 12:09 PM

## 2013-07-11 LAB — CULTURE, BLOOD (ROUTINE X 2)
CULTURE: NO GROWTH
Culture: NO GROWTH

## 2013-07-11 NOTE — Progress Notes (Addendum)
[facility]                                                                                              Progress note     Patient Demographics  Deborah Graves, is a 73 y.o. female  ZOX:[id number]  WJX:[id number]  DOB - 1940-12-19  Admit date - 06/18/2013  Admitting Physician Elnora MorrisonAhmad B Barakat, MD  Outpatient Primary MD for the patient is No primary provider on file.  LOS - 23   Chief complaint   Respiratory failure   Anxiety         Subjective:   Unable to obtain due to sedation, no report of diarrhea Objective:   Vital signs  Temperature at 99.6 Heart rate 91 Respiratory rate  18 Blood pressure  150/54 Pulse ox   97% Exam Sedated, No new F.N deficits, agitated at times .AT,  moist mucous membranes, ET tube in place Supple Neck,No JVD, No cervical lymphadenopathy appriciated.  Tracheostomy midline Symmetrical Chest wall movement, diminished at bases right more than left. with scattered rhonchi and wheezing RRR,No Gallops,Rubs or new Murmurs, No Parasternal Heave +ve B.Sounds, Abd Soft, Non tender, No organomegaly appriciated, No rebound - guarding or rigidity. No Cyanosis, Clubbing with +1 edema bilaterally   I&Os 1380/1050 Tracheostomy tube #6 Shiley placed on 4/3  Data Review   CBC  Recent Labs Lab 07/05/13 0605 07/06/13 0500 07/08/13 0445 07/09/13 0600 07/10/13 0800  WBC 13.4* 15.0* 16.3* 18.2* 16.8*  HGB 6.6* 9.9* 10.0* 9.5* 8.8*  HCT 20.7* 29.7* 31.8* 30.0* 28.2*  PLT 265 274 321 366 381  MCV 86.3 86.1 88.6 89.8 89.8  MCH 27.5 28.7 27.9 28.4 28.0  MCHC 31.9 33.3 31.4 31.7 31.2  RDW 15.0 14.9 14.9 15.1 15.2  LYMPHSABS  --   --  3.1  --   --   MONOABS  --   --  1.5*  --   --   EOSABS  --   --  0.7  --   --   BASOSABS  --   --  0.2*  --   --     Chemistries   Recent Labs Lab 07/05/13 0605 07/06/13 0500 07/08/13 0445 07/09/13 0600  NA 132* 131* 137  141  K 4.9 5.5* 5.9* 4.3  CL 93* 93* 100 99  CO2 24 24 22 28   GLUCOSE 169* 151* 89 131*  BUN 53* 60* 62* 60*  CREATININE 2.37* 2.22* 1.92* 1.79*  CALCIUM 8.4 8.6 8.9 9.1     Cardiac Enzymes No results found for this basename: CK, CKMB, TROPONINI, MYOGLOBIN,  in the last 168 hours ------------------------------------------------------------------------------------------------------------------ No components found with this basename: POCBNP,   Micro Results Recent Results (from the past 240 hour(s))  CULTURE, BLOOD (ROUTINE X 2)     Status: None   Collection Time    07/04/13  7:10 PM      Result Value Ref Range Status   Specimen Description BLOOD LEFT HAND   Final   Special Requests BOTTLES DRAWN AEROBIC ONLY 5CC   Final   Culture  Setup Time     Final  Value: 07/05/2013 02:26     Performed at [facility]   Culture     Final   Value:        BLOOD CULTURE RECEIVED NO GROWTH TO DATE CULTURE WILL BE HELD FOR 5 DAYS BEFORE ISSUING A FINAL NEGATIVE REPORT     Performed at [facility]   Report Status PENDING   Incomplete  CULTURE, BLOOD (ROUTINE X 2)     Status: None   Collection Time    07/04/13  7:20 PM      Result Value Ref Range Status   Specimen Description BLOOD LEFT HAND   Final   Special Requests BOTTLES DRAWN AEROBIC ONLY 5CC   Final   Culture  Setup Time     Final   Value: 07/05/2013 02:26     Performed at [facility]   Culture     Final   Value:        BLOOD CULTURE RECEIVED NO GROWTH TO DATE CULTURE WILL BE HELD FOR 5 DAYS BEFORE ISSUING A FINAL NEGATIVE REPORT     Performed at [facility]   Report Status PENDING   Incomplete     Chest x-ray on 4/18 shows congestive heart failure with pleural effusion on the right and bilateral infiltrates  Assessment & Plan    VDRF , 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, continue with e PSV  Weaning ,  not weaning very       well,  Line sepsis/SIRS with staph hominis  cultured in transferring hospital treated Bilateral pneumonia on cefepime Mild non-ST elevation MI at transferring hospital, continue with statin COPD continue with nebulizer treatment Upper GI bleed with gastroccult positive in transferring hospital off aspirin for now Congestive heart failure; 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  Leukocytosis, no fever, chest x-ray negative for pneumonia, check urinalysis and procalcitonin Plan Restart Lasix Continue cefepime Check labs in a.m. Decrease water per tube Critical-care time 34 minutes   Code Status: Full  DVT Prophylaxis  heparin   Carron CurieAli Breona Cherubin M.D on 07/11/2013 at 11:29 AM

## 2013-07-12 ENCOUNTER — Other Ambulatory Visit (HOSPITAL_COMMUNITY): Payer: Self-pay

## 2013-07-12 LAB — BASIC METABOLIC PANEL
BUN: 47 mg/dL — ABNORMAL HIGH (ref 6–23)
CALCIUM: 8.7 mg/dL (ref 8.4–10.5)
CO2: 27 meq/L (ref 19–32)
CREATININE: 1.4 mg/dL — AB (ref 0.50–1.10)
Chloride: 100 mEq/L (ref 96–112)
GFR calc Af Amer: 43 mL/min — ABNORMAL LOW (ref >90)
GFR calc non Af Amer: 37 mL/min — ABNORMAL LOW (ref >90)
Glucose, Bld: 133 mg/dL — ABNORMAL HIGH (ref 70–99)
Potassium: 4.2 mEq/L (ref 3.7–5.3)
Sodium: 141 mEq/L (ref 137–147)

## 2013-07-12 LAB — CBC
HCT: 29 % — ABNORMAL LOW (ref 36.0–46.0)
Hemoglobin: 9.2 g/dL — ABNORMAL LOW (ref 12.0–15.0)
MCH: 28.6 pg (ref 26.0–34.0)
MCHC: 31.7 g/dL (ref 30.0–36.0)
MCV: 90.1 fL (ref 78.0–100.0)
PLATELETS: 354 10*3/uL (ref 150–400)
RBC: 3.22 MIL/uL — ABNORMAL LOW (ref 3.87–5.11)
RDW: 14.7 % (ref 11.5–15.5)
WBC: 15.8 10*3/uL — AB (ref 4.0–10.5)

## 2013-07-12 NOTE — Progress Notes (Signed)
PULMONARY / CRITICAL CARE MEDICINE   Name: Deborah Graves MRN: [id number] DOB: 1940-10-04    CONSULTATION DATE:  06/21/2013   CHIEF COMPLAINT:  Acute respiratory failure  BRIEF PATIENT DESCRIPTION:  73 y/o female admitted to outside hospital with COPD, pneumonia, sepsis, Staph hominas bacteremia and hypercarbic respiratory failure requiring intubation.  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    EVENTS  4/13 - weaning on PSV 12/5, 30%, hgb low, pt to receive transfusion  4/15 rt thora for 450 cc per IR  SUBJECTIVE: RT reports pt to wean 12 hours on ps 12/5(not doing well due to increased rr)  PHYSICAL EXAMINATION: General:  Chronically ill appearing white female on vent Neuro:  left side weak HEENT:  Trach cdi Cardiovascular:  rrr Lungs: diminished breath sounds Abdomen:  Soft, obese, positive bowel sounds , peg in place Musculoskeletal:  Generalized edema  Skin:  Scattered area of ecchymosis   LABS:  Recent Labs Lab 07/08/13 0445 07/09/13 0600 07/12/13 0500  NA 137 141 141  K 5.9* 4.3 4.2  CL 100 99 100  CO2 22 28 27   BUN 62* 60* 47*  CREATININE 1.92* 1.79* 1.40*  GLUCOSE 89 131* 133*    Recent Labs Lab 07/09/13 0600 07/10/13 0800 07/12/13 0500  HGB 9.5* 8.8* 9.2*  HCT 30.0* 28.2* 29.0*  WBC 18.2* 16.8* 15.8*  PLT 366 381 354   IMAGING: Dg Chest Port 1 View  07/12/2013   CLINICAL DATA:  Respiratory failure.  EXAM: PORTABLE CHEST - 1 VIEW  COMPARISON:  DG CHEST 1V PORT dated 07/10/2013; DG CHEST 1V PORT dated 07/09/2013  FINDINGS: There is less rotation on today's exam than on the prior study. Tracheostomy and right upper extremity PICC appear unchanged. Small bilateral pleural effusions with basilar atelectasis pulmonary vascular congestion is present. Interstitial pulmonary edema.  IMPRESSION: Persistent unchanged mild CHF.   Electronically Signed   By:  Andreas NewportGeoffrey  Lamke M.D.   On: 07/12/2013 08:16   ASSESSMENT: Acute respiratory failure 2nd to HCAP, pulmonary edema, and suspected COPD. Acute on chronic systolic heart failure. Possible CVA. Acute encephalopathy / delirium. S/p PEG Placement 4/10.  PLAN: Continue bronchodilators, ICS Antibiotics per primary team May need to discuss with family about goals of care given lack of progress with vent weaning Keep on slow weaning protocol select.   PCCM will see again on Thursday 4/223   [facility] ACNP [facility] Pager [phone] till 3 pm If no answer page [phone] 07/12/2013, 9:21 AM  Coralyn HellingVineet Joell Usman, MD [facility] 07/12/2013, 10:13 AM Pager:  [phone] After 3pm call: [phone]

## 2013-07-12 NOTE — Progress Notes (Signed)
[facility]                                                                                              Progress note     Patient Demographics  Deborah Graves, is a 73 y.o. female  ZOX:[id number]  WJX:[id number]  DOB - 04-27-1940  Admit date - 06/18/2013  Admitting Physician Elnora MorrisonAhmad B Barakat, MD  Outpatient Primary MD for the patient is No primary provider on file.  LOS - 24   Chief complaint   Respiratory failure   Anxiety         Subjective:   Unable to obtain due to sedation, no report of diarrhea Objective:   Vital signs  Temperature at 98 Heart rate 77 Respiratory rate  18 Blood pressure  152/45 Pulse ox   97% Exam Sedated, No new F.N deficits, agitated at times [place].AT,  moist mucous membranes, ET tube in place Supple Neck,No JVD, No cervical lymphadenopathy appriciated.  Tracheostomy midline Symmetrical Chest wall movement, diminished at bases right more than left. with scattered rhonchi and wheezing RRR,No Gallops,Rubs or new Murmurs, No Parasternal Heave +ve B.Sounds, Abd Soft, Non tender, No organomegaly appriciated, No rebound - guarding or rigidity. No Cyanosis, Clubbing with +1 edema bilaterally   I&Os 1540/950 Tracheostomy tube #6 Shiley placed on 4/3  Data Review   CBC  Recent Labs Lab 07/06/13 0500 07/08/13 0445 07/09/13 0600 07/10/13 0800 07/12/13 0500  WBC 15.0* 16.3* 18.2* 16.8* 15.8*  HGB 9.9* 10.0* 9.5* 8.8* 9.2*  HCT 29.7* 31.8* 30.0* 28.2* 29.0*  PLT 274 321 366 381 354  MCV 86.1 88.6 89.8 89.8 90.1  MCH 28.7 27.9 28.4 28.0 28.6  MCHC 33.3 31.4 31.7 31.2 31.7  RDW 14.9 14.9 15.1 15.2 14.7  LYMPHSABS  --  3.1  --   --   --   MONOABS  --  1.5*  --   --   --   EOSABS  --  0.7  --   --   --   BASOSABS  --  0.2*  --   --   --     Chemistries   Recent Labs Lab 07/06/13 0500 07/08/13 0445 07/09/13 0600 07/12/13 0500  NA 131* 137 141 141   K 5.5* 5.9* 4.3 4.2  CL 93* 100 99 100  CO2 24 22 28 27   GLUCOSE 151* 89 131* 133*  BUN 60* 62* 60* 47*  CREATININE 2.22* 1.92* 1.79* 1.40*  CALCIUM 8.6 8.9 9.1 8.7     Cardiac Enzymes No results found for this basename: CK, CKMB, TROPONINI, MYOGLOBIN,  in the last 168 hours ------------------------------------------------------------------------------------------------------------------ No components found with this basename: POCBNP,   Micro Results Recent Results (from the past 240 hour(s))  CULTURE, BLOOD (ROUTINE X 2)     Status: None   Collection Time    07/04/13  7:10 PM      Result Value Ref Range Status   Specimen Description BLOOD LEFT HAND   Final   Special Requests BOTTLES DRAWN AEROBIC ONLY 5CC   Final   Culture  Setup Time     Final  Value: 07/05/2013 02:26     Performed at [facility]   Culture     Final   Value: NO GROWTH 5 DAYS     Performed at [facility]   Report Status 07/11/2013 FINAL   Final  CULTURE, BLOOD (ROUTINE X 2)     Status: None   Collection Time    07/04/13  7:20 PM      Result Value Ref Range Status   Specimen Description BLOOD LEFT HAND   Final   Special Requests BOTTLES DRAWN AEROBIC ONLY 5CC   Final   Culture  Setup Time     Final   Value: 07/05/2013 02:26     Performed at [facility]   Culture     Final   Value: NO GROWTH 5 DAYS     Performed at [facility]   Report Status 07/11/2013 FINAL   Final     Chest x-ray on 4/18 shows congestive heart failure with pleural effusion on the right and bilateral infiltrates  Assessment & Plan    VDRF , 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, continue with e PSV  Weaning ,  not weaning very       well,  Line sepsis/SIRS with staph hominis cultured in transferring hospital treated Bilateral pneumonia on cefepime Mild non-ST elevation MI at transferring hospital, continue with statin COPD continue with nebulizer treatment Upper GI bleed  with gastroccult positive in transferring hospital off aspirin for now Congestive heart failure; 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  Leukocytosis, no fever, chest x-ray negative for pneumonia, check urinalysis and procalcitonin Plan Continue same management We'll discuss CODE STATUS and goals with family either today or tomorrow, Since patient is not improving and treatment seems to be futile  Code Status: Full  DVT Prophylaxis  heparin   Carron CurieAli Rodricus Candelaria M.D on 07/12/2013 at 12:43 PM

## 2013-07-13 NOTE — Progress Notes (Addendum)
[facility]                                                                                              Progress note     Patient Demographics  Deborah CowerMartha Heslin, is a 73 y.o. female  ZOX:[id number]  WJX:[id number]  DOB - 1940/06/02  Admit date - 06/18/2013  Admitting Physician Elnora MorrisonAhmad B Barakat, MD  Outpatient Primary MD for the patient is No primary provider on file.  LOS - 25   Chief complaint   Respiratory failure   Anxiety         Subjective:   Unable to obtain due to sedation, no report of diarrhea Objective:   Vital signs  Temperature at 100 Heart rate 88 Respiratory rate  26 Blood pressure  140/54 Pulse ox   96% Exam Sedated, No new F.N deficits, agitated at times [place].AT,  moist mucous membranes, ET tube in place Supple Neck,No JVD, No cervical lymphadenopathy appriciated.  Tracheostomy midline Symmetrical Chest wall movement, diminished at bases right more than left. with scattered rhonchi and wheezing RRR,No Gallops,Rubs or new Murmurs, No Parasternal Heave +ve B.Sounds, Abd Soft, Non tender, No organomegaly appriciated, No rebound - guarding or rigidity. No Cyanosis, Clubbing with +1 edema bilaterally   I&Os 1719/360 Tracheostomy tube #6 Shiley placed on 4/3  Data Review   CBC  Recent Labs Lab 07/08/13 0445 07/09/13 0600 07/10/13 0800 07/12/13 0500  WBC 16.3* 18.2* 16.8* 15.8*  HGB 10.0* 9.5* 8.8* 9.2*  HCT 31.8* 30.0* 28.2* 29.0*  PLT 321 366 381 354  MCV 88.6 89.8 89.8 90.1  MCH 27.9 28.4 28.0 28.6  MCHC 31.4 31.7 31.2 31.7  RDW 14.9 15.1 15.2 14.7  LYMPHSABS 3.1  --   --   --   MONOABS 1.5*  --   --   --   EOSABS 0.7  --   --   --   BASOSABS 0.2*  --   --   --     Chemistries   Recent Labs Lab 07/08/13 0445 07/09/13 0600 07/12/13 0500  NA 137 141 141  K 5.9* 4.3 4.2  CL 100 99 100  CO2 22 28 27   GLUCOSE 89 131* 133*  BUN 62* 60* 47*   CREATININE 1.92* 1.79* 1.40*  CALCIUM 8.9 9.1 8.7     Cardiac Enzymes No results found for this basename: CK, CKMB, TROPONINI, MYOGLOBIN,  in the last 168 hours ------------------------------------------------------------------------------------------------------------------ No components found with this basename: POCBNP,   Micro Results Recent Results (from the past 240 hour(s))  CULTURE, BLOOD (ROUTINE X 2)     Status: None   Collection Time    07/04/13  7:10 PM      Result Value Ref Range Status   Specimen Description BLOOD LEFT HAND   Final   Special Requests BOTTLES DRAWN AEROBIC ONLY 5CC   Final   Culture  Setup Time     Final   Value: 07/05/2013 02:26     Performed at [facility]   Culture     Final   Value: NO GROWTH 5 DAYS  Performed at [facility]   Report Status 07/11/2013 FINAL   Final  CULTURE, BLOOD (ROUTINE X 2)     Status: None   Collection Time    07/04/13  7:20 PM      Result Value Ref Range Status   Specimen Description BLOOD LEFT HAND   Final   Special Requests BOTTLES DRAWN AEROBIC ONLY 5CC   Final   Culture  Setup Time     Final   Value: 07/05/2013 02:26     Performed at [facility]   Culture     Final   Value: NO GROWTH 5 DAYS     Performed at [facility]   Report Status 07/11/2013 FINAL   Final     Chest x-ray on 4/18 shows congestive heart failure with pleural effusion on the right and bilateral infiltrates  Assessment & Plan    VDRF , 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, failing weaning repeatedly. Very poor prognosis.            Discussed with pulmonary critical care and ID  Line sepsis/SIRS with staph hominis cultured in transferring hospital treated Bilateral pneumonia on cefepime Mild non-ST elevation MI at transferring hospital, continue with statin COPD continue with nebulizer treatment Upper GI bleed with gastroccult positive in transferring hospital off aspirin for  now Congestive heart failure; mixed systolic and diastolic with ejection fraction 38 percent;   Diabetes mellitus type 2 continue Levemir and insulin sliding scale CVA continue with statin off aspirin due to GI bleed Hypothyroidism normal studies Encephalopathy not improving, although of fentanyl and Versed drips History of Noncompliance Protein calorie malnutrition on Nepro at 35 cc per hour Acute on chronic kidney injury off ACE inhibitor improving Hyperkalemia resolved Hypertension; improving  Encephalopathy off Versed drip, no significant change Bilateral large pleural effusions status post bilateral thoracentesis.  Leukocytosis, no fever, chest x-ray negative for pneumonia, check urinalysis and procalcitonin Plan Check labs in a.m. Discussed CODE STATUS with the family who did not make up their mind to get but they seem to be going towards [facility] Critical care time 37 minutes Code Status: Full  DVT Prophylaxis  heparin   Carron CurieAli Jamilah Jean M.D on 07/13/2013 at 12:35 PM

## 2013-07-14 ENCOUNTER — Other Ambulatory Visit (HOSPITAL_COMMUNITY): Payer: Self-pay

## 2013-07-14 ENCOUNTER — Inpatient Hospital Stay (HOSPITAL_COMMUNITY)
Admission: AD | Admit: 2013-07-14 | Discharge: 2013-07-14 | Disposition: A | Discharge status: Home / Self Care | Payer: Self-pay | Source: Ambulatory Visit | Attending: Internal Medicine | Admitting: Internal Medicine

## 2013-07-14 LAB — CBC
HCT: 28.6 % — ABNORMAL LOW (ref 36.0–46.0)
HEMOGLOBIN: 8.9 g/dL — AB (ref 12.0–15.0)
MCH: 28.3 pg (ref 26.0–34.0)
MCHC: 31.1 g/dL (ref 30.0–36.0)
MCV: 90.8 fL (ref 78.0–100.0)
Platelets: 322 10*3/uL (ref 150–400)
RBC: 3.15 MIL/uL — ABNORMAL LOW (ref 3.87–5.11)
RDW: 14.9 % (ref 11.5–15.5)
WBC: 19.6 10*3/uL — ABNORMAL HIGH (ref 4.0–10.5)

## 2013-07-14 LAB — BASIC METABOLIC PANEL
BUN: 37 mg/dL — ABNORMAL HIGH (ref 6–23)
CO2: 29 mEq/L (ref 19–32)
Calcium: 8.7 mg/dL (ref 8.4–10.5)
Chloride: 103 mEq/L (ref 96–112)
Creatinine, Ser: 1.28 mg/dL — ABNORMAL HIGH (ref 0.50–1.10)
GFR, EST AFRICAN AMERICAN: 48 mL/min — AB (ref >90)
GFR, EST NON AFRICAN AMERICAN: 41 mL/min — AB (ref >90)
Glucose, Bld: 155 mg/dL — ABNORMAL HIGH (ref 70–99)
POTASSIUM: 4.1 meq/L (ref 3.7–5.3)
SODIUM: 144 meq/L (ref 137–147)

## 2013-07-14 NOTE — Progress Notes (Signed)
Routine EEG completed at bedside. Results pending. 

## 2013-07-14 NOTE — Progress Notes (Addendum)
[facility]                                                                                              Progress note     Patient Demographics  Deborah Graves, is a 73 y.o. female  VZD:[id number]  IRJ:[id number]  DOB - 05-24-1940  Admit date - 06/18/2013  Admitting Physician Elnora MorrisonAhmad B Barakat, MD  Outpatient Primary MD for the patient is No primary provider on file.  LOS - 26   Chief complaint   Respiratory failure   Anxiety         Subjective:   Unable to obtain due to sedation, no report of diarrhea Objective:   Vital signs  Temperature at 100 Heart rate 65 Respiratory rate  17 Blood pressure  153/59 Pulse ox   97% Exam Sedated, No new F.N deficits, agitated at times [place].AT,  moist mucous membranes, ET tube in place Supple Neck,No JVD, No cervical lymphadenopathy appriciated.  Tracheostomy midline Symmetrical Chest wall movement, diminished at bases right more than left. with scattered rhonchi and wheezing RRR,No Gallops,Rubs or new Murmurs, No Parasternal Heave +ve B.Sounds, Abd Soft, Non tender, No organomegaly appriciated, No rebound - guarding or rigidity. No Cyanosis, Clubbing with +1 edema bilaterally   I&Os 1420/?? Tracheostomy tube #6 Shiley placed on 4/3  Data Review   CBC  Recent Labs Lab 07/08/13 0445 07/09/13 0600 07/10/13 0800 07/12/13 0500 07/14/13 0600  WBC 16.3* 18.2* 16.8* 15.8* 19.6*  HGB 10.0* 9.5* 8.8* 9.2* 8.9*  HCT 31.8* 30.0* 28.2* 29.0* 28.6*  PLT 321 366 381 354 322  MCV 88.6 89.8 89.8 90.1 90.8  MCH 27.9 28.4 28.0 28.6 28.3  MCHC 31.4 31.7 31.2 31.7 31.1  RDW 14.9 15.1 15.2 14.7 14.9  LYMPHSABS 3.1  --   --   --   --   MONOABS 1.5*  --   --   --   --   EOSABS 0.7  --   --   --   --   BASOSABS 0.2*  --   --   --   --     Chemistries   Recent Labs Lab 07/08/13 0445 07/09/13 0600 07/12/13 0500 07/14/13 0600  NA 137 141 141 144   K 5.9* 4.3 4.2 4.1  CL 100 99 100 103  CO2 22 28 27 29   GLUCOSE 89 131* 133* 155*  BUN 62* 60* 47* 37*  CREATININE 1.92* 1.79* 1.40* 1.28*  CALCIUM 8.9 9.1 8.7 8.7     Cardiac Enzymes No results found for this basename: CK, CKMB, TROPONINI, MYOGLOBIN,  in the last 168 hours ------------------------------------------------------------------------------------------------------------------ No components found with this basename: POCBNP,   Micro Results Recent Results (from the past 240 hour(s))  CULTURE, BLOOD (ROUTINE X 2)     Status: None   Collection Time    07/04/13  7:10 PM      Result Value Ref Range Status   Specimen Description BLOOD LEFT HAND   Final   Special Requests BOTTLES DRAWN AEROBIC ONLY 5CC   Final   Culture  Setup Time     Final  Value: 07/05/2013 02:26     Performed at [facility]   Culture     Final   Value: NO GROWTH 5 DAYS     Performed at [facility]   Report Status 07/11/2013 FINAL   Final  CULTURE, BLOOD (ROUTINE X 2)     Status: None   Collection Time    07/04/13  7:20 PM      Result Value Ref Range Status   Specimen Description BLOOD LEFT HAND   Final   Special Requests BOTTLES DRAWN AEROBIC ONLY 5CC   Final   Culture  Setup Time     Final   Value: 07/05/2013 02:26     Performed at [facility]   Culture     Final   Value: NO GROWTH 5 DAYS     Performed at [facility]   Report Status 07/11/2013 FINAL   Final     Chest x-ray on 4/22 shows congestive heart failure with worsening pleural effusions  Assessment & Plan    VDRF , 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, failing weaning repeatedly. Very poor prognosis.            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  History of Noncompliance Protein calorie malnutrition on Nepro at 35 cc per hour Acute on chronic kidney injury off ACE inhibitor improving Hyperkalemia resolved Hypertension; improving  Encephalopathy off Versed drip, no significant change Bilateral large pleural effusions status post bilateral thoracentesis.  Leukocytosis, no fever, chest x-ray negative for pneumonia, vancomycin per tube to be started Plan DC Seroquel Increase Lasix to 40 mg IV Vancomycin per tube Discussed CODE STATUS with the family who did not make up their mind to get but they seem to be going towards [facility]  Code Status: Full  DVT Prophylaxis  heparin   Carron CurieAli Alezandra Egli M.D on 07/14/2013 at 11:52 AM

## 2013-07-15 NOTE — Procedures (Signed)
EEG report.  Brief clinical history: .73 y/o female admitted to outside hospital with COPD, pneumonia, sepsis, Staph hominas bacteremia and hypercarbic respiratory failure requiring intubation. 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  No prior history of frank epileptic seizures.  Technique: this is a 17 channel routine scalp EEG performed at the bedside in the ICU, with bipolar and monopolar montages arranged in accordance to the international 10/20 system of electrode placement. One channel was dedicated to EKG recording.  The patient is sedated, intubated on the vent No activating procedures performed.  Description: the best background is difficult to determine and the overall recording consisted of diffuse, continuous 7-8 Hz activity that doesn't follow an ictal pattern at any time. There is also intermixed normal sleep architecture. No focal or generalized epileptiform discharges noted.  EKG showed sinus rhythm.  Impression: this is an abnormal EEG with findings indicative of a mild encephalopathy, non specific as to cause. No evidence of electrographic seizures. Clinical correlation is advised.  Wyatt Portelasvaldo Camilo, MD

## 2013-07-15 NOTE — Progress Notes (Signed)
PULMONARY / CRITICAL CARE MEDICINE   Name: Deborah Graves MRN: [id number] DOB: 11/06/1940    CONSULTATION DATE:  06/21/2013   CHIEF COMPLAINT:  Acute respiratory failure  BRIEF PATIENT DESCRIPTION:  73 y/o female admitted to outside hospital with COPD, pneumonia, sepsis, Staph hominas bacteremia and hypercarbic respiratory failure requiring intubation.  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    EVENTS  4/13 - weaning on PSV 12/5, 30%, hgb low, pt to receive transfusion  4/15 rt thora for 450 cc per IR  SUBJECTIVE: Currently on full vent support with set rr 18 with no overbreathing vent.  PHYSICAL EXAMINATION: General:  Chronically ill appearing white female on vent Neuro:  left side weak HEENT:  Trach cdi Cardiovascular:  rrr Lungs: diminished breath sounds Abdomen:  Soft, obese, positive bowel sounds , peg in place, tf at goal Musculoskeletal:  Generalized edema  Skin:  Scattered area of ecchymosis (improving)  LABS:  Recent Labs Lab 07/09/13 0600 07/12/13 0500 07/14/13 0600  NA 141 141 144  K 4.3 4.2 4.1  CL 99 100 103  CO2 28 27 29   BUN 60* 47* 37*  CREATININE 1.79* 1.40* 1.28*  GLUCOSE 131* 133* 155*    Recent Labs Lab 07/10/13 0800 07/12/13 0500 07/14/13 0600  HGB 8.8* 9.2* 8.9*  HCT 28.2* 29.0* 28.6*  WBC 16.8* 15.8* 19.6*  PLT 381 354 322   IMAGING: Dg Chest Port 1 View  07/14/2013   CLINICAL DATA:  Respiratory failure  EXAM: PORTABLE CHEST - 1 VIEW  COMPARISON:  Portable exam 0630 hr compared to 07/12/2013  FINDINGS: Tip of right arm PICC line projects over SVC.  Tracheostomy tube stable.  Enlargement of cardiac silhouette with pulmonary vascular congestion.  Bilateral pulmonary infiltrates greatest in perihilar regions, slightly greater on right, consistent with pulmonary edema.  Question small right basilar pleural effusion.  No pneumothorax.   Bones demineralized.  IMPRESSION: Persistent CHF with question small right pleural effusion.   Electronically Signed   By: Ulyses SouthwardMark  Boles M.D.   On: 07/14/2013 08:13   ASSESSMENT: Acute respiratory failure 2nd to HCAP, pulmonary edema, and suspected COPD. Acute on chronic systolic heart failure. Possible CVA. Acute encephalopathy / delirium. Notr Neuro eval and EEG S/p PEG Placement 4/10.  PLAN: Continue bronchodilators, ICS Consider decrease R to 16 on  4/23 Antibiotics per primary team May need to discuss with family about goals of care given lack of progress with vent weaning Keep on slow weaning protocol select.   PCCM will see again on Monday 4/27   [facility] ACNP [facility] Pager [phone] till 3 pm If no answer page [phone] 07/15/2013, 8:37 AM   Reviewed above, and examined.  Coralyn HellingVineet Harlon Kutner, MD [facility] 07/15/2013, 10:22 AM Pager:  [phone] After 3pm call: [phone]

## 2013-07-16 ENCOUNTER — Other Ambulatory Visit (HOSPITAL_COMMUNITY): Payer: Self-pay

## 2013-07-17 LAB — BASIC METABOLIC PANEL
BUN: 37 mg/dL — ABNORMAL HIGH (ref 6–23)
CHLORIDE: 97 meq/L (ref 96–112)
CO2: 32 mEq/L (ref 19–32)
Calcium: 8.7 mg/dL (ref 8.4–10.5)
Creatinine, Ser: 0.93 mg/dL (ref 0.50–1.10)
GFR calc non Af Amer: 60 mL/min — ABNORMAL LOW (ref >90)
GFR, EST AFRICAN AMERICAN: 70 mL/min — AB (ref >90)
Glucose, Bld: 231 mg/dL — ABNORMAL HIGH (ref 70–99)
POTASSIUM: 3.6 meq/L — AB (ref 3.7–5.3)
Sodium: 143 mEq/L (ref 137–147)

## 2013-07-17 LAB — CBC
HEMATOCRIT: 31.8 % — AB (ref 36.0–46.0)
HEMOGLOBIN: 9.8 g/dL — AB (ref 12.0–15.0)
MCH: 28.2 pg (ref 26.0–34.0)
MCHC: 30.8 g/dL (ref 30.0–36.0)
MCV: 91.4 fL (ref 78.0–100.0)
Platelets: 403 10*3/uL — ABNORMAL HIGH (ref 150–400)
RBC: 3.48 MIL/uL — ABNORMAL LOW (ref 3.87–5.11)
RDW: 14.5 % (ref 11.5–15.5)
WBC: 14 10*3/uL — AB (ref 4.0–10.5)

## 2013-07-17 LAB — MAGNESIUM: MAGNESIUM: 1.7 mg/dL (ref 1.5–2.5)

## 2013-07-18 ENCOUNTER — Other Ambulatory Visit (HOSPITAL_COMMUNITY): Payer: Self-pay

## 2013-07-19 NOTE — Progress Notes (Signed)
[facility]                                                                                              Progress note     Patient Demographics  Deborah Graves, is a 73 y.o. female  NWG:[id number]  VHQ:[id number]  DOB - [birth year]  Admit date - 06/18/2013  Admitting Physician Elnora MorrisonAhmad B Barakat, MD  Outpatient Primary MD for the patient is No primary provider on file.  LOS - 31   Chief complaint   Respiratory failure   Anxiety         Subjective:   Unable to obtain due to sedation, no report of diarrhea Objective:   Vital signs  Temperature at 98.7 Heart rate 93 Respiratory rate  19 Blood pressure  136/64 Pulse ox   98% Exam Sedated, No new F.N deficits, agitated at times [place].AT,  moist mucous membranes, ET tube in place Supple Neck,No JVD, No cervical lymphadenopathy appriciated.  Tracheostomy midline Symmetrical Chest wall movement, diminished at bases right more than left. with scattered rhonchi and wheezing RRR,No Gallops,Rubs or new Murmurs, No Parasternal Heave +ve B.Sounds, Abd Soft, Non tender, No organomegaly appriciated, No rebound - guarding or rigidity. No Cyanosis, Clubbing with +1 edema bilaterally   I&Os 1830/2250 Tracheostomy tube #6 Shiley placed on 4/3  Data Review   CBC  Recent Labs Lab 07/14/13 0600 07/17/13 0500  WBC 19.6* 14.0*  HGB 8.9* 9.8*  HCT 28.6* 31.8*  PLT 322 403*  MCV 90.8 91.4  MCH 28.3 28.2  MCHC 31.1 30.8  RDW 14.9 14.5    Chemistries   Recent Labs Lab 07/14/13 0600 07/17/13 0500  NA 144 143  K 4.1 3.6*  CL 103 97  CO2 29 32  GLUCOSE 155* 231*  BUN 37* 37*  CREATININE 1.28* 0.93  CALCIUM 8.7 8.7  MG  --  1.7     Cardiac Enzymes No results found for this basename: CK, CKMB, TROPONINI, MYOGLOBIN,  in the last 168  hours ------------------------------------------------------------------------------------------------------------------ No components found with this basename: POCBNP,   Micro Results No results found for this or any previous visit (from the past 240 hour(s)).   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 Very poor prognosis.            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  History of Noncompliance Protein calorie malnutrition on Nepro at 35 cc per hour Acute on chronic  kidney injury off ACE inhibitor improving Hyperkalemia resolved Hypertension; improving  Encephalopathy off Versed drip, no significant change Bilateral large pleural effusions status post bilateral thoracentesis.  Leukocytosis, no fever, chest x-ray negative for pneumonia, vancomycin per tube to be started Plan DC Seroquel Increase Lasix to 40 mg IV twice a day Continue with Vancomycin per tube Change Klonopin to when necessary  Code Status: Full  DVT Prophylaxis  heparin   Carron CurieAli Victoria Henshaw M.D on 07/19/2013 at 10:58 AM

## 2013-07-19 NOTE — Progress Notes (Addendum)
PULMONARY / CRITICAL CARE MEDICINE   Name: Deborah Graves MRN: [id number] DOB: [birth year]    CONSULTATION DATE:  06/21/2013   CHIEF COMPLAINT:  Acute respiratory failure  BRIEF PATIENT DESCRIPTION:  73 y/o female admitted to outside hospital with COPD, pneumonia, sepsis, Staph hominas bacteremia and hypercarbic respiratory failure requiring intubation.  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    EVENTS  4/13 - weaning on PSV 12/5, 30%, hgb low, pt to receive transfusion  4/15 rt thora for 450 cc per IR 4/26: CT chest: small bilateral effusions/ atx.   SUBJECTIVE: Currently on full vent support with set rr 18 with no overbreathing vent.  PHYSICAL EXAMINATION: General:  Chronically ill appearing white female on vent Neuro:  left side weak HEENT:  Trach cdi Cardiovascular:  rrr Lungs: diminished breath sounds Abdomen:  Soft, obese, positive bowel sounds , peg in place, tf at goal Musculoskeletal:  Generalized edema  Skin:  Scattered area of ecchymosis (improving)  LABS:  Recent Labs Lab 07/14/13 0600 07/17/13 0500  NA 144 143  K 4.1 3.6*  CL 103 97  CO2 29 32  BUN 37* 37*  CREATININE 1.28* 0.93  GLUCOSE 155* 231*    Recent Labs Lab 07/14/13 0600 07/17/13 0500  HGB 8.9* 9.8*  HCT 28.6* 31.8*  WBC 19.6* 14.0*  PLT 322 403*   IMAGING: Ct Chest Wo Contrast  07/18/2013   CLINICAL DATA:  Follow-up of pleural effusion  EXAM: CT CHEST WITHOUT CONTRAST  TECHNIQUE: Multidetector CT imaging of the chest was performed following the standard protocol without IV contrast.  COMPARISON:  DG CHEST 1V PORT dated 07/16/2013; CT CHEST W/O CM dated 07/04/2013  FINDINGS: The moderate-sized bilateral pleural effusions have decreased in volume such that they would now be considered small. Small amounts of atelectasis adjacent to the effusions persists. The cardiac chambers are  top-normal in size. There is no pericardial effusion. There are coronary artery calcifications. The caliber of the thoracic aorta is normal. There is no mediastinal or hilar lymphadenopathy. A tracheostomy appliance is in place. There is prominent thoracic kyphosis without evidence of a compression fracture. The sternum and visualized portions of the ribs exhibit no acute abnormalities. Within the upper abdomen the observed portions of the liver and spleen and adrenal glands appear normal.  IMPRESSION: 1. There has been an interval decrease in the volume of pleural fluid bilaterally such that now small pleural effusions are present layering posteriorly. There remains small amount of atelectasis adjacent to the effusions especially in the right lower lobe. 2. There is no pericardial effusion. There are coronary artery calcifications. 3. No pathologic size mediastinal or hilar lymph nodes are demonstrated.   Electronically Signed   By: David  Swazil[place]   On: 07/18/2013 21:28   ASSESSMENT: Acute respiratory failure 2nd to HCAP, pulmonary edema, and suspected COPD. Acute on chronic systolic heart failure. Possible CVA. Acute encephalopathy / delirium. Note Neuro eval and EEG S/p PEG Placement 4/10.  Discussion Oral vanc.. cdiff neg Don't think she can come off vent, has end-stage cardiopulm disease.  Effusions do not explain VDRF  PLAN: Continue bronchodilators, ICS Push diuresis further for neg 1 liter daily as tolerated Consider d/c vanc Keep on slow weaning protocol select. May need to discuss with family about goals of care given lack of progress with vent weaning.. Per family pt would not want prolonged care.  Re attempt PS 22  Anders SimmondsPete Babcock ACNP-BC [facility] Pager # [phone] OR # [phone] if no answer  I have fully examined this patient and agree with above findings.    And edited infull  Deborah RossettiDaniel J. Tyson AliasFeinstein, MD, FACP Pgr: [phone] [place] [facility]

## 2013-07-20 NOTE — Progress Notes (Signed)
[facility]                                                                                              Progress note     Patient Demographics  Deborah Graves, is a 73 y.o. female  WGN:[id number]  HQI:[id number]  DOB - 09-10-1940  Admit date - 06/18/2013  Admitting Physician Elnora MorrisonAhmad B Barakat, MD  Outpatient Primary MD for the patient is No primary provider on file.  LOS - 32   Chief complaint   Respiratory failure   Anxiety         Subjective:   Unable to obtain due to sedation,   Objective:   Vital signs  Temperature at 98.9 Heart rate 80 Respiratory rate  19 Blood pressure  159/64 Pulse ox   98% Exam Mild improvement in mental status , No new F.N deficits, agitated at times [place].AT,  moist mucous membranes, ET tube in place Supple Neck,No JVD, No cervical lymphadenopathy appriciated.  Tracheostomy midline Symmetrical Chest wall movement, diminished at bases right more than left. with scattered rhonchi and wheezing RRR,No Gallops,Rubs or new Murmurs, No Parasternal Heave +ve B.Sounds, Abd Soft, Non tender, No organomegaly appriciated, No rebound - guarding or rigidity. No Cyanosis, Clubbing with +1 edema bilaterally   I&Os 2238/2200 Tracheostomy tube #6 Shiley placed on 4/3  Data Review   CBC  Recent Labs Lab 07/14/13 0600 07/17/13 0500  WBC 19.6* 14.0*  HGB 8.9* 9.8*  HCT 28.6* 31.8*  PLT 322 403*  MCV 90.8 91.4  MCH 28.3 28.2  MCHC 31.1 30.8  RDW 14.9 14.5    Chemistries   Recent Labs Lab 07/14/13 0600 07/17/13 0500  NA 144 143  K 4.1 3.6*  CL 103 97  CO2 29 32  GLUCOSE 155* 231*  BUN 37* 37*  CREATININE 1.28* 0.93  CALCIUM 8.7 8.7  MG  --  1.7     Cardiac Enzymes No results found for this basename: CK, CKMB, TROPONINI, MYOGLOBIN,  in the last 168  hours ------------------------------------------------------------------------------------------------------------------ No components found with this basename: POCBNP,   Micro Results No results found for this or any previous visit (from the past 240 hour(s)).   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 Very poor prognosis.            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  History of Noncompliance Protein calorie malnutrition on Nepro at 35 cc per hour  Acute on chronic kidney injury off ACE inhibitor improving Hyperkalemia resolved Hypertension; improving  Encephalopathy off Versed drip, no significant change Bilateral large pleural effusions status post bilateral thoracentesis.  Leukocytosis, no fever, chest x-ray negative for pneumonia, vancomycin per tube to be started  Plan Discussed CODE STATUS with the family again Check labs in a.m. DC Lipitor Check TSH  Code Status: Full  DVT Prophylaxis  heparin   Carron CurieAli Bryndan Bilyk M.D on 07/20/2013 at 12:00 PM

## 2013-07-21 ENCOUNTER — Institutional Professional Consult (permissible substitution) (HOSPITAL_COMMUNITY): Payer: Medicare Other

## 2013-07-21 LAB — VITAMIN B12: Vitamin B-12: 877 pg/mL (ref 211–911)

## 2013-07-21 LAB — BASIC METABOLIC PANEL
BUN: 48 mg/dL — ABNORMAL HIGH (ref 6–23)
CO2: 34 meq/L — AB (ref 19–32)
CREATININE: 1.16 mg/dL — AB (ref 0.50–1.10)
Calcium: 9.2 mg/dL (ref 8.4–10.5)
Chloride: 94 mEq/L — ABNORMAL LOW (ref 96–112)
GFR calc Af Amer: 54 mL/min — ABNORMAL LOW (ref >90)
GFR calc non Af Amer: 46 mL/min — ABNORMAL LOW (ref >90)
Glucose, Bld: 203 mg/dL — ABNORMAL HIGH (ref 70–99)
POTASSIUM: 4 meq/L (ref 3.7–5.3)
Sodium: 143 mEq/L (ref 137–147)

## 2013-07-21 LAB — TSH: TSH: 3.34 u[IU]/mL (ref 0.350–4.500)

## 2013-07-21 LAB — CBC
HEMATOCRIT: 29.5 % — AB (ref 36.0–46.0)
Hemoglobin: 9.2 g/dL — ABNORMAL LOW (ref 12.0–15.0)
MCH: 28.1 pg (ref 26.0–34.0)
MCHC: 31.2 g/dL (ref 30.0–36.0)
MCV: 90.2 fL (ref 78.0–100.0)
Platelets: 427 10*3/uL — ABNORMAL HIGH (ref 150–400)
RBC: 3.27 MIL/uL — ABNORMAL LOW (ref 3.87–5.11)
RDW: 14.5 % (ref 11.5–15.5)
WBC: 13.6 10*3/uL — AB (ref 4.0–10.5)

## 2013-07-21 NOTE — Progress Notes (Signed)
[facility]                                                                                              Progress note     Patient Demographics  Deborah Graves, is a [age] y.o. female  [id number]  [id number]  DOB - 12/11/1940  Admit date - 06/18/2013  Admitting Physician Deborah MorrisonAhmad B Barakat, MD  Outpatient Primary MD for the patient is No primary provider on file.  LOS - 33   Chief complaint   Respiratory failure   Anxiety         Subjective:   Unable to obtain due to confusion,   Objective:   Vital signs  Temperature at 99 Heart rate 73 Respiratory rate  18 Blood pressure  107/60 Pulse ox   98% Exam Mild improvement in mental status , No new F.N deficits, agitated at times [place].AT,  moist mucous membranes, ET tube in place Supple Neck,No JVD, No cervical lymphadenopathy appriciated.  Tracheostomy midline Symmetrical Chest wall movement, diminished at bases right more than left. with scattered rhonchi and wheezing RRR,No Gallops,Rubs or new Murmurs, No Parasternal Heave +ve B.Sounds, Abd Soft, Non tender, No organomegaly appriciated, No rebound - guarding or rigidity. No Cyanosis, Clubbing with +1 edema bilaterally   I&Os 2257/1850 Tracheostomy tube #6 Shiley placed on 4/3  Data Review   CBC  Recent Labs Lab 07/17/13 0500 07/21/13 0500  WBC 14.0* 13.6*  HGB 9.8* 9.2*  HCT 31.8* 29.5*  PLT 403* 427*  MCV 91.4 90.2  MCH 28.2 28.1  MCHC 30.8 31.2  RDW 14.5 14.5    Chemistries   Recent Labs Lab 07/17/13 0500 07/21/13 0500  NA 143 143  K 3.6* 4.0  CL 97 94*  CO2 32 34*  GLUCOSE 231* 203*  BUN 37* 48*  CREATININE 0.93 1.16*  CALCIUM 8.7 9.2  MG 1.7  --      Cardiac Enzymes No results found for this basename: CK, CKMB, TROPONINI, MYOGLOBIN,  in the last 168  hours ------------------------------------------------------------------------------------------------------------------ No components found with this basename: POCBNP,   Micro Results No results found for this or any previous visit (from the past 240 hour(s)).   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 Very poor prognosis.            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  History of Noncompliance Protein calorie malnutrition on Nepro at 35 cc per hour  Acute on chronic kidney injury off ACE inhibitor improving Hyperkalemia resolved Hypertension; improving  Encephalopathy off Versed drip, no significant change Bilateral large pleural effusions status post bilateral thoracentesis.  Leukocytosis, no fever, chest x-ray negative for pneumonia, vancomycin per tube to be started  Plan  Had a meeting with the family today and explained to them that she will probably end up on a ventilator for a long time. They explained to me that these are not her wishes and will probably make her comfort care this weekend after all the family is around . [name] Code Status: Full  DVT Prophylaxis  heparin   Deborah Graves  M.D on 07/21/2013 at 12:25 PM

## 2013-07-22 NOTE — Progress Notes (Addendum)
[facility]                                                                                              Progress note     Patient Demographics  Deborah Graves, is a 73 y.o. female  [id number]:[id number]  ZHY:[id number]  DOB - 1940/04/09  Admit date - 06/18/2013  Admitting Physician Elnora MorrisonAhmad B Barakat, MD  Outpatient Primary MD for the patient is No primary provider on file.  LOS - 34   Chief complaint   Respiratory failure   Anxiety         Subjective:   Unable to obtain due to confusion,   Objective:   Vital signs  Temperature 98.7 Heart rate 70 Respiratory rate  18 Blood pressure  157/78 Pulse ox   98% Exam Mild improvement in mental status , No new F.N deficits, agitated at times [place].AT,  moist mucous membranes, ET tube in place Supple Neck,No JVD, No cervical lymphadenopathy appriciated.  Tracheostomy midline Symmetrical Chest wall movement, diminished at bases right more than left. with scattered rhonchi and wheezing RRR,No Gallops,Rubs or new Murmurs, No Parasternal Heave +ve B.Sounds, Abd Soft, Non tender, No organomegaly appriciated, No rebound - guarding or rigidity. No Cyanosis, Clubbing with +1 edema bilaterally   I&Os 1881/1900 Tracheostomy tube #6 Shiley placed on 4/3  Data Review   CBC  Recent Labs Lab 07/17/13 0500 07/21/13 0500  WBC 14.0* 13.6*  HGB 9.8* 9.2*  HCT 31.8* 29.5*  PLT 403* 427*  MCV 91.4 90.2  MCH 28.2 28.1  MCHC 30.8 31.2  RDW 14.5 14.5    Chemistries   Recent Labs Lab 07/17/13 0500 07/21/13 0500  NA 143 143  K 3.6* 4.0  CL 97 94*  CO2 32 34*  GLUCOSE 231* 203*  BUN 37* 48*  CREATININE 0.93 1.16*  CALCIUM 8.7 9.2  MG 1.7  --      Cardiac Enzymes No results found for this basename: CK, CKMB, TROPONINI, MYOGLOBIN,  in the last 168  hours ------------------------------------------------------------------------------------------------------------------ No components found with this basename: POCBNP,   Micro Results No results found for this or any previous visit (from the past 240 hour(s)).   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 Very poor prognosis.            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  History of Noncompliance Protein calorie malnutrition on Nepro at 35 cc per hour Acute  on chronic kidney injury off ACE inhibitor improving Hyperkalemia resolved Hypertension; improving  Encephalopathy off Versed drip, no significant change Bilateral large pleural effusions status post bilateral thoracentesis.  Leukocytosis, no fever, chest x-ray negative for pneumonia, vancomycin per tube to be started  Plan  Discussed with the family again. Patient will be comfort care over the weekend after all family members arrive. DC by mouth vancomycin. [name] Code Status: Full  DVT Prophylaxis  heparin   Carron CurieAli Debroah Shuttleworth M.D on 07/22/2013 at 1:01 PM

## 2013-07-23 NOTE — Progress Notes (Signed)
[facility]                                                                                              Progress note     Patient Demographics  Deborah Graves, is a 73 y.o. female  ZOX:[id number]  [id number]:[id number]  DOB - 1940/06/12  Admit date - 06/18/2013  Admitting Physician Elnora MorrisonAhmad B Barakat, MD  Outpatient Primary MD for the patient is No primary provider on file.  LOS - 35   Chief complaint   Respiratory failure   Anxiety         Subjective:   Unable to obtain due to confusion,   Objective:   Vital signs  Temperature 98. Heart rate 81 Respiratory rate  20 Blood pressure  147/67 Pulse ox   99% Exam Obtunded , No new F.N deficits, agitated at times [place].AT,  moist mucous membranes, ET tube in place Supple Neck,No JVD, No cervical lymphadenopathy appriciated.  Tracheostomy midline Symmetrical Chest wall movement, diminished at bases right more than left. with scattered rhonchi and wheezing RRR,No Gallops,Rubs or new Murmurs, No Parasternal Heave +ve B.Sounds, Abd Soft, Non tender, No organomegaly appriciated, No rebound - guarding or rigidity. No Cyanosis, Clubbing with +1 edema bilaterally   I&Os 1447/1425 Tracheostomy tube #6 Shiley placed on 4/3  Data Review   CBC  Recent Labs Lab 07/17/13 0500 07/21/13 0500  WBC 14.0* 13.6*  HGB 9.8* 9.2*  HCT 31.8* 29.5*  PLT 403* 427*  MCV 91.4 90.2  MCH 28.2 28.1  MCHC 30.8 31.2  RDW 14.5 14.5    Chemistries   Recent Labs Lab 07/17/13 0500 07/21/13 0500  NA 143 143  K 3.6* 4.0  CL 97 94*  CO2 32 34*  GLUCOSE 231* 203*  BUN 37* 48*  CREATININE 0.93 1.16*  CALCIUM 8.7 9.2  MG 1.7  --      Cardiac Enzymes No results found for this basename: CK, CKMB, TROPONINI, MYOGLOBIN,  in the last 168  hours ------------------------------------------------------------------------------------------------------------------ No components found with this basename: POCBNP,   Micro Results No results found for this or any previous visit (from the past 240 hour(s)).   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 Very poor prognosis.            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  History of Noncompliance Protein calorie malnutrition on Nepro at 35 cc per hour Acute on chronic kidney injury  off ACE inhibitor improving Hyperkalemia resolved Hypertension; improving  Encephalopathy off Versed drip, no significant change Bilateral large pleural effusions status post bilateral thoracentesis.  Leukocytosis, no fever, chest x-ray negative for pneumonia, vancomycin per tube to be started Hyperglycemia  Plan  Glucotrol 5 mg daily Patient will be comfort care over the weekend after all family members arrive. [name] Code Status: Full  DVT Prophylaxis  heparin   Carron CurieAli Ayven Pheasant M.D on 07/23/2013 at 12:11 PM

## 2013-07-24 NOTE — Progress Notes (Signed)
[facility]                                                                                              Progress note     Patient Demographics  Deborah Graves, is a 73 y.o. female  [id number]  [id number]  DOB - 04-12-1940  Admit date - 06/18/2013  Admitting Physician Elnora MorrisonAhmad B Barakat, MD  Outpatient Primary MD for the patient is No primary provider on file.  LOS - 36   Chief complaint   Respiratory failure   Anxiety         Subjective:   Unable to obtain due to confusion,   Objective:   Vital signs  Temperature 97.6 Heart rate77 Respiratory rate  20 Blood pressure  110/47 Pulse ox   100% Exam  Obtunded , No new F.N deficits, agitated at times .AT,  moist mucous membranes, ET tube in place Supple Neck,No JVD, No cervical lymphadenopathy appriciated.  Tracheostomy midline Symmetrical Chest wall movement, diminished at bases right more than left. with scattered rhonchi and wheezing RRR,No Gallops,Rubs or new Murmurs, No Parasternal Heave +ve B.Sounds, Abd Soft, Non tender, No organomegaly appriciated, No rebound - guarding or rigidity. No Cyanosis, Clubbing with +1 edema bilaterally   I&Os 1916/2500 Tracheostomy tube #6 Shiley placed on 4/3  Data Review   CBC  Recent Labs Lab 07/21/13 0500  WBC 13.6*  HGB 9.2*  HCT 29.5*  PLT 427*  MCV 90.2  MCH 28.1  MCHC 31.2  RDW 14.5    Chemistries   Recent Labs Lab 07/21/13 0500  NA 143  K 4.0  CL 94*  CO2 34*  GLUCOSE 203*  BUN 48*  CREATININE 1.16*  CALCIUM 9.2     Cardiac Enzymes No results found for this basename: CK, CKMB, TROPONINI, MYOGLOBIN,  in the last 168 hours ------------------------------------------------------------------------------------------------------------------ No components found with this basename: POCBNP,   Micro Results No results found for this or any previous visit  (from the past 240 hour(s)).   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 Very poor prognosis.            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  History of Noncompliance Protein calorie malnutrition on Nepro at 35 cc per hour Acute on chronic kidney injury off ACE inhibitor improving Hyperkalemia resolved Hypertension; improving  Encephalopathy off Versed drip, no significant change Bilateral large pleural effusions status post bilateral thoracentesis.  Leukocytosis,  no fever, chest x-ray negative for pneumonia, vancomycin per tube to be started Hyperglycemia on Glucotrol improving  Plan  Still awaiting family decision [name] Code Status: Full  DVT Prophylaxis  heparin   Carron CurieAli Laurie Lovejoy M.D on 07/24/2013 at 11:29 AM

## 2013-07-25 NOTE — Progress Notes (Addendum)
[facility]                                                                                              Progress note     Patient Demographics  Deborah Graves, is a 73 y.o. female  [id number]  [id number]:[id number]  DOB - 09/22/1940  Admit date - 06/18/2013  Admitting Physician Elnora MorrisonAhmad B Barakat, MD  Outpatient Primary MD for the patient is No primary provider on file.  LOS - 37   Chief complaint   Respiratory failure   Anxiety         Subjective:   Unable to obtain due to confusion,   Objective:   Vital signs  Temperature 99 Heart rate80 Respiratory rate  20 Blood pressure  97/42 Pulse ox   95% Exam  Obtunded , No new F.N deficits, agitated at times [place].AT,  moist mucous membranes, ET tube in place Supple Neck,No JVD, No cervical lymphadenopathy appriciated.  Tracheostomy midline Symmetrical Chest wall movement, diminished at bases right more than left. with scattered rhonchi and wheezing RRR,No Gallops,Rubs or new Murmurs, No Parasternal Heave +ve B.Sounds, Abd Soft, Non tender, No organomegaly appriciated, No rebound - guarding or rigidity. No Cyanosis, Clubbing with +1 edema bilaterally   I&Os 2116/1550 Tracheostomy tube #6 Shiley placed on 4/3  Data Review   CBC  Recent Labs Lab 07/21/13 0500  WBC 13.6*  HGB 9.2*  HCT 29.5*  PLT 427*  MCV 90.2  MCH 28.1  MCHC 31.2  RDW 14.5    Chemistries   Recent Labs Lab 07/21/13 0500  NA 143  K 4.0  CL 94*  CO2 34*  GLUCOSE 203*  BUN 48*  CREATININE 1.16*  CALCIUM 9.2     Cardiac Enzymes No results found for this basename: CK, CKMB, TROPONINI, MYOGLOBIN,  in the last 168 hours ------------------------------------------------------------------------------------------------------------------ No components found with this basename: POCBNP,   Micro Results No results found for this or any previous visit (from  the past 240 hour(s)).   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 Very poor prognosis.            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  History of Noncompliance Protein calorie malnutrition on Nepro at 35 cc per hour Acute on chronic kidney injury off ACE inhibitor improving Hyperkalemia resolved Hypertension; improving  Encephalopathy off Versed drip, no significant change Bilateral large pleural effusions status post bilateral thoracentesis.  Leukocytosis,  no fever, chest x-ray negative for pneumonia, vancomycin per tube to be started Hyperglycemia on Glucotrol improving  Plan  Still awaiting family decision [name] Code Status: DO NOT RESUSCITATE  DVT Prophylaxis  heparin   Carron CurieAli Kaida Games M.D on 07/25/2013 at 12:13 PM

## 2013-07-28 LAB — BASIC METABOLIC PANEL
BUN: 91 mg/dL — ABNORMAL HIGH (ref 6–23)
CHLORIDE: 86 meq/L — AB (ref 96–112)
CO2: 30 mEq/L (ref 19–32)
Calcium: 8.9 mg/dL (ref 8.4–10.5)
Creatinine, Ser: 2.1 mg/dL — ABNORMAL HIGH (ref 0.50–1.10)
GFR, EST AFRICAN AMERICAN: 26 mL/min — AB (ref >90)
GFR, EST NON AFRICAN AMERICAN: 23 mL/min — AB (ref >90)
Glucose, Bld: 197 mg/dL — ABNORMAL HIGH (ref 70–99)
POTASSIUM: 4.5 meq/L (ref 3.7–5.3)
SODIUM: 131 meq/L — AB (ref 137–147)

## 2013-07-28 LAB — CBC
HCT: 26.8 % — ABNORMAL LOW (ref 36.0–46.0)
Hemoglobin: 8.6 g/dL — ABNORMAL LOW (ref 12.0–15.0)
MCH: 28.1 pg (ref 26.0–34.0)
MCHC: 32.1 g/dL (ref 30.0–36.0)
MCV: 87.6 fL (ref 78.0–100.0)
PLATELETS: 295 10*3/uL (ref 150–400)
RBC: 3.06 MIL/uL — ABNORMAL LOW (ref 3.87–5.11)
RDW: 15.1 % (ref 11.5–15.5)
WBC: 13.8 10*3/uL — AB (ref 4.0–10.5)

## 2013-07-29 LAB — CBC
HEMATOCRIT: 27.9 % — AB (ref 36.0–46.0)
HEMOGLOBIN: 9.1 g/dL — AB (ref 12.0–15.0)
MCH: 28.3 pg (ref 26.0–34.0)
MCHC: 32.6 g/dL (ref 30.0–36.0)
MCV: 86.9 fL (ref 78.0–100.0)
Platelets: 312 10*3/uL (ref 150–400)
RBC: 3.21 MIL/uL — AB (ref 3.87–5.11)
RDW: 15.3 % (ref 11.5–15.5)
WBC: 14.9 10*3/uL — AB (ref 4.0–10.5)

## 2013-07-29 LAB — BASIC METABOLIC PANEL
BUN: 91 mg/dL — AB (ref 6–23)
CHLORIDE: 87 meq/L — AB (ref 96–112)
CO2: 28 mEq/L (ref 19–32)
Calcium: 9.1 mg/dL (ref 8.4–10.5)
Creatinine, Ser: 1.9 mg/dL — ABNORMAL HIGH (ref 0.50–1.10)
GFR, EST AFRICAN AMERICAN: 30 mL/min — AB (ref >90)
GFR, EST NON AFRICAN AMERICAN: 25 mL/min — AB (ref >90)
GLUCOSE: 169 mg/dL — AB (ref 70–99)
Potassium: 4.7 mEq/L (ref 3.7–5.3)
Sodium: 132 mEq/L — ABNORMAL LOW (ref 137–147)

## 2015-11-05 IMAGING — DX DG CHEST 1V PORT
1 series · 1 of 1 positions shown · non-contrast
Comparison: None.

CLINICAL DATA: ET and NG tube placement.

EXAM:
PORTABLE CHEST - 1 VIEW

[portable]
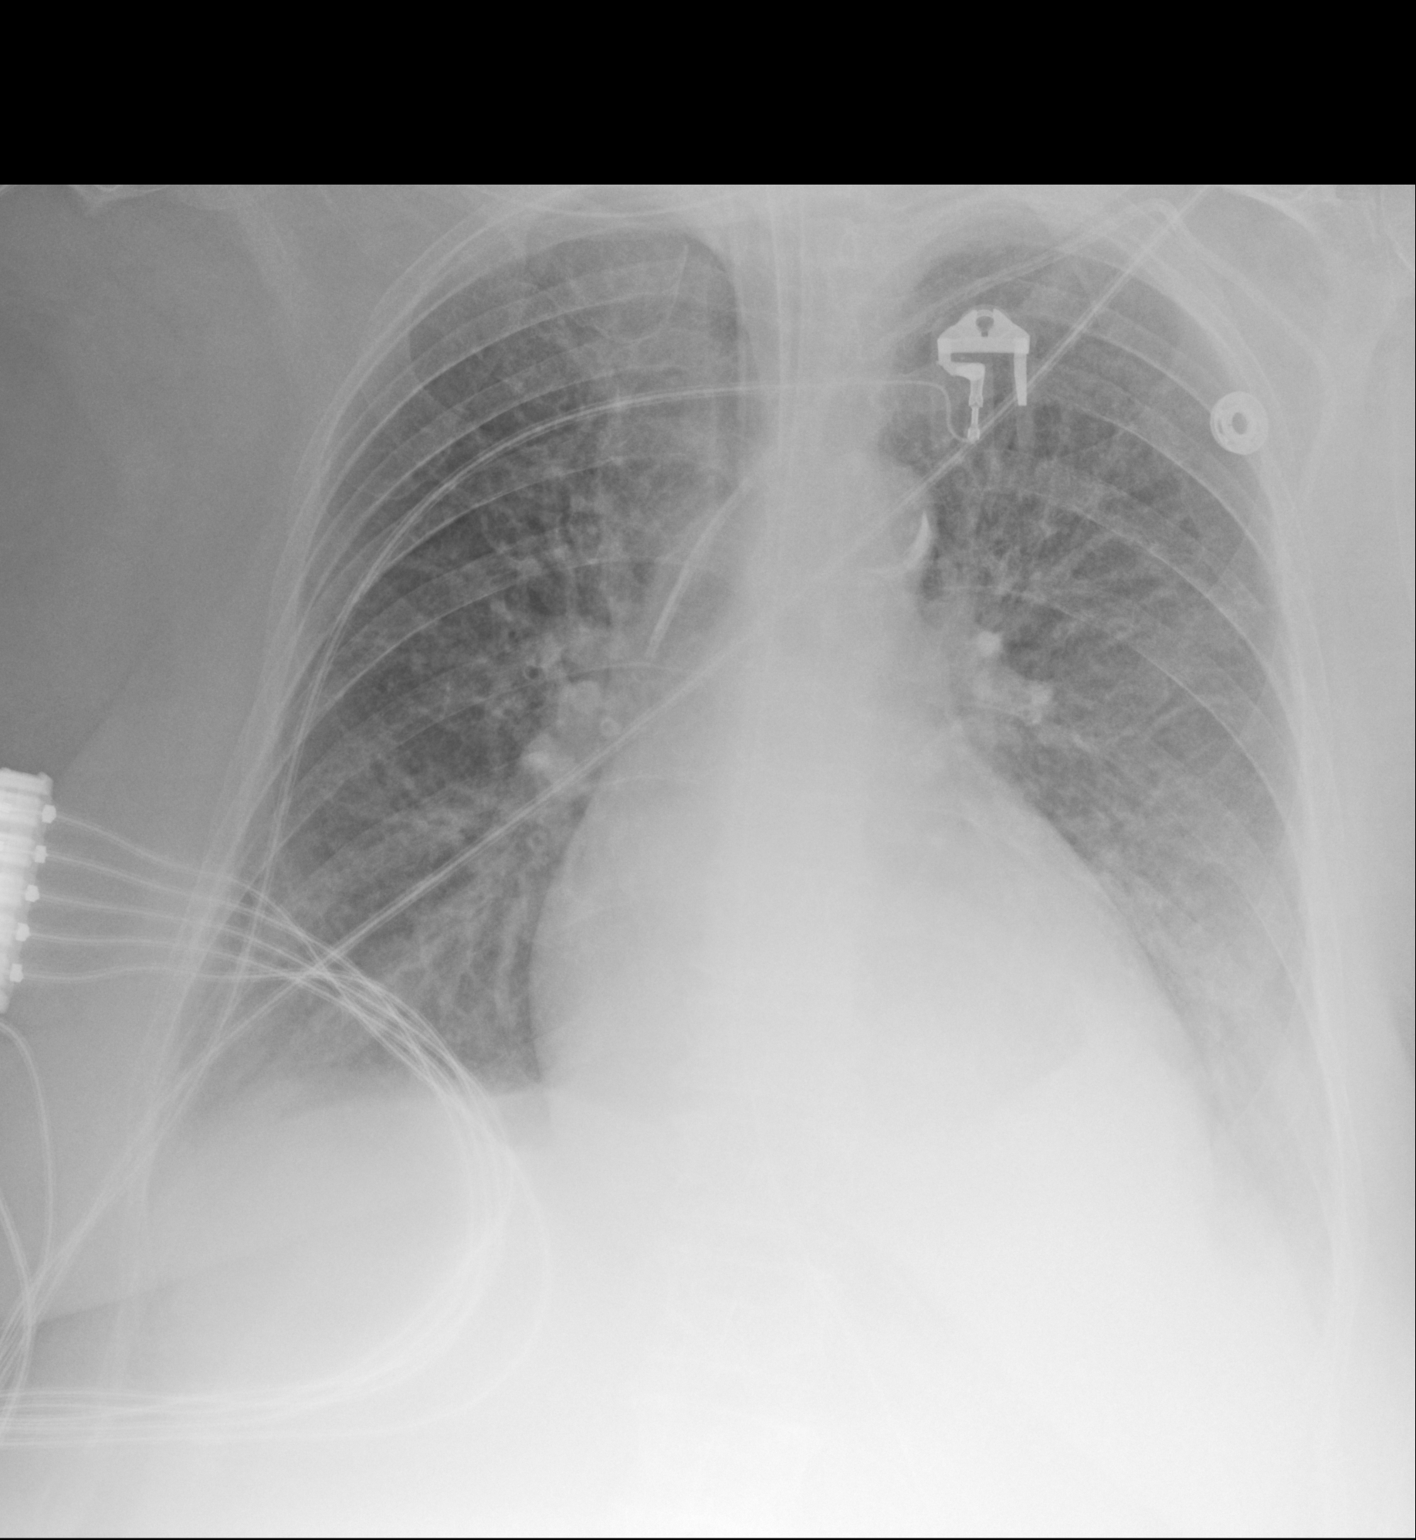

[1 of 1 positions shown; findings below may reference images not displayed]

FINDINGS: The heart is mildly enlarged. There is tortuosity and calcification
of the thoracic aorta. Mild peribronchial thickening and increased
interstitial markings may suggest mild interstitial pulmonary edema.
Small pleural effusions are possible.

The endotracheal tube is in good position, 3 cm above the carina.
The left subclavian central venous catheter tip is in the mid distal
SVC. The NG tube is coursing down into the stomach.
IMPRESSION: Support apparatus in good position without complicating features.

Cardiac enlargement and probable mild interstitial edema.

## 2015-11-05 IMAGING — DX DG ABD PORTABLE 1V
1 series · 1 of 1 positions shown · non-contrast
Comparison: None.

CLINICAL DATA: NG tube placement.

EXAM:
PORTABLE ABDOMEN - 1 VIEW

[supine ap]
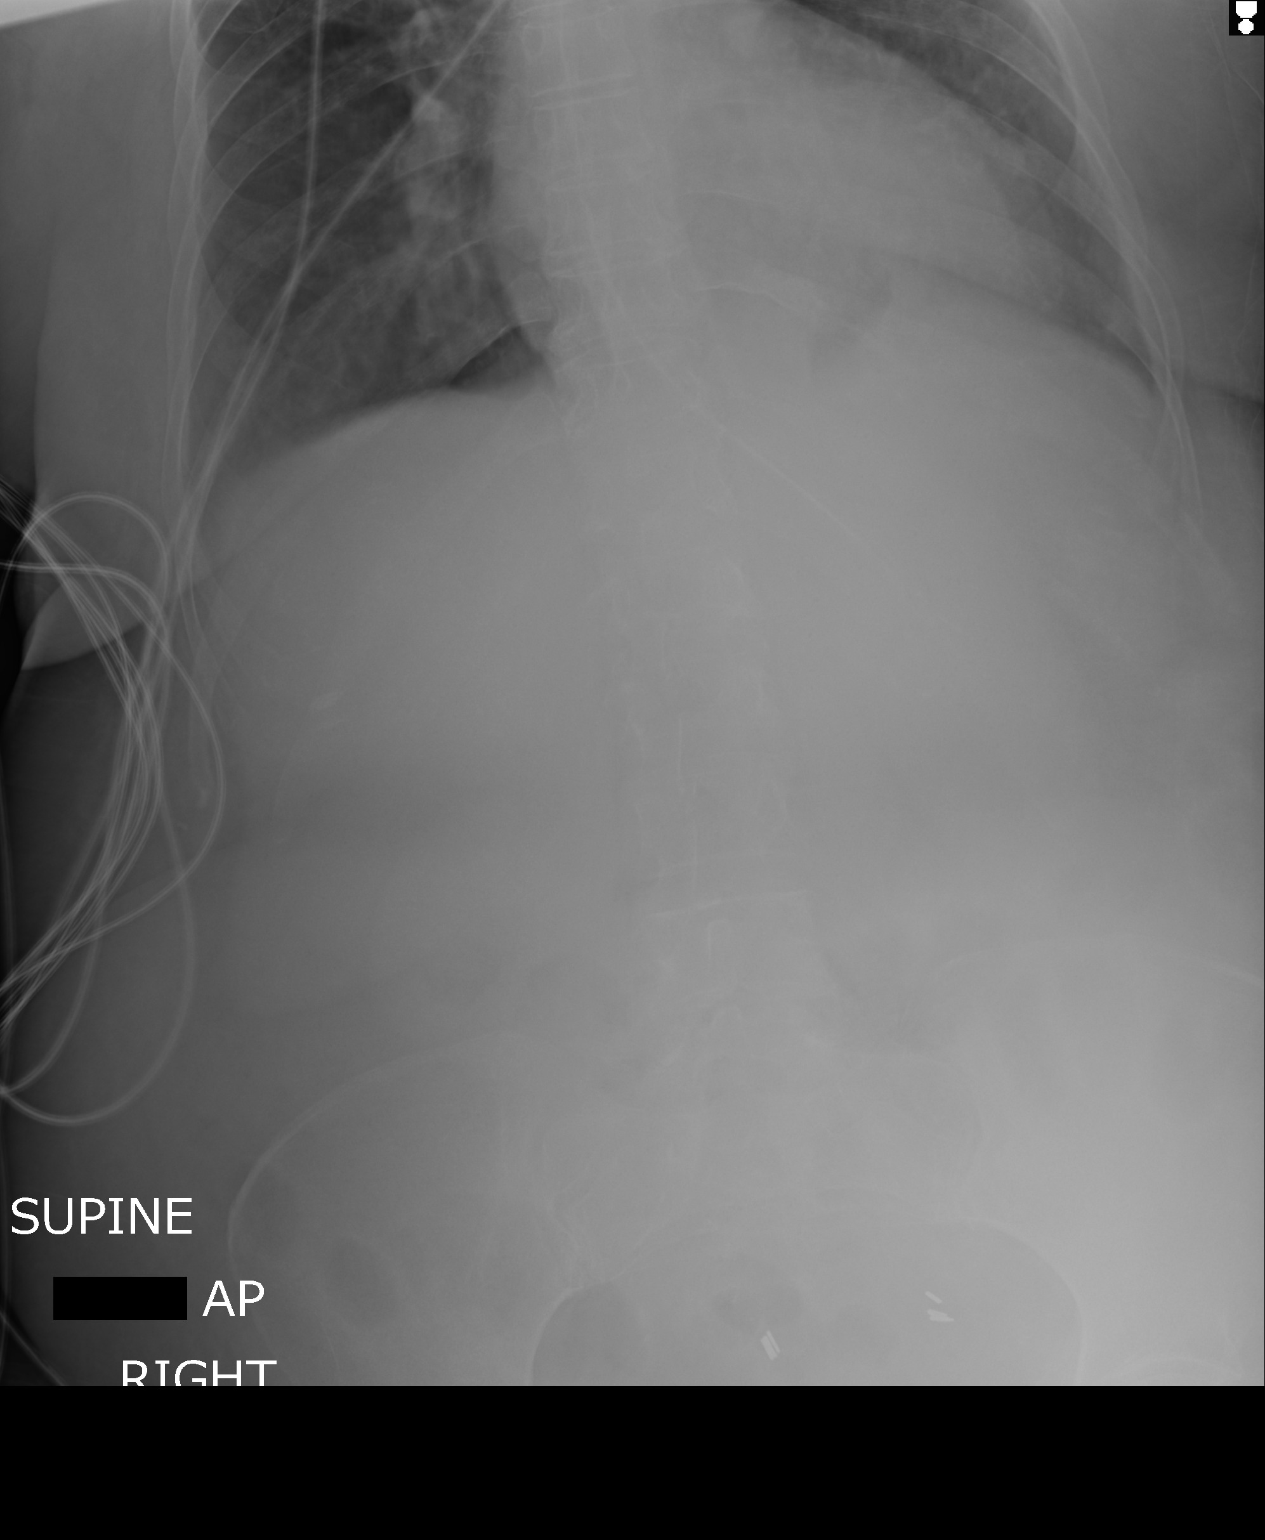

[1 of 1 positions shown; findings below may reference images not displayed]

FINDINGS: NG tube noted with tip projected over stomach. Gas pattern
nonspecific. No free air. Surgical clips within pelvis. Cardiomegaly
with basilar atelectasis.
IMPRESSION: 1.  NG tube noted with tip projected over the stomach.

2. Cardiomegaly with basilar atelectasis.

## 2015-11-08 IMAGING — CR DG ABD PORTABLE 1V
1 series · 1 of 1 positions shown · non-contrast
Comparison: 06/18/2013

CLINICAL DATA: Small bowel obstruction.

EXAM:
PORTABLE ABDOMEN - 1 VIEW

[AP]
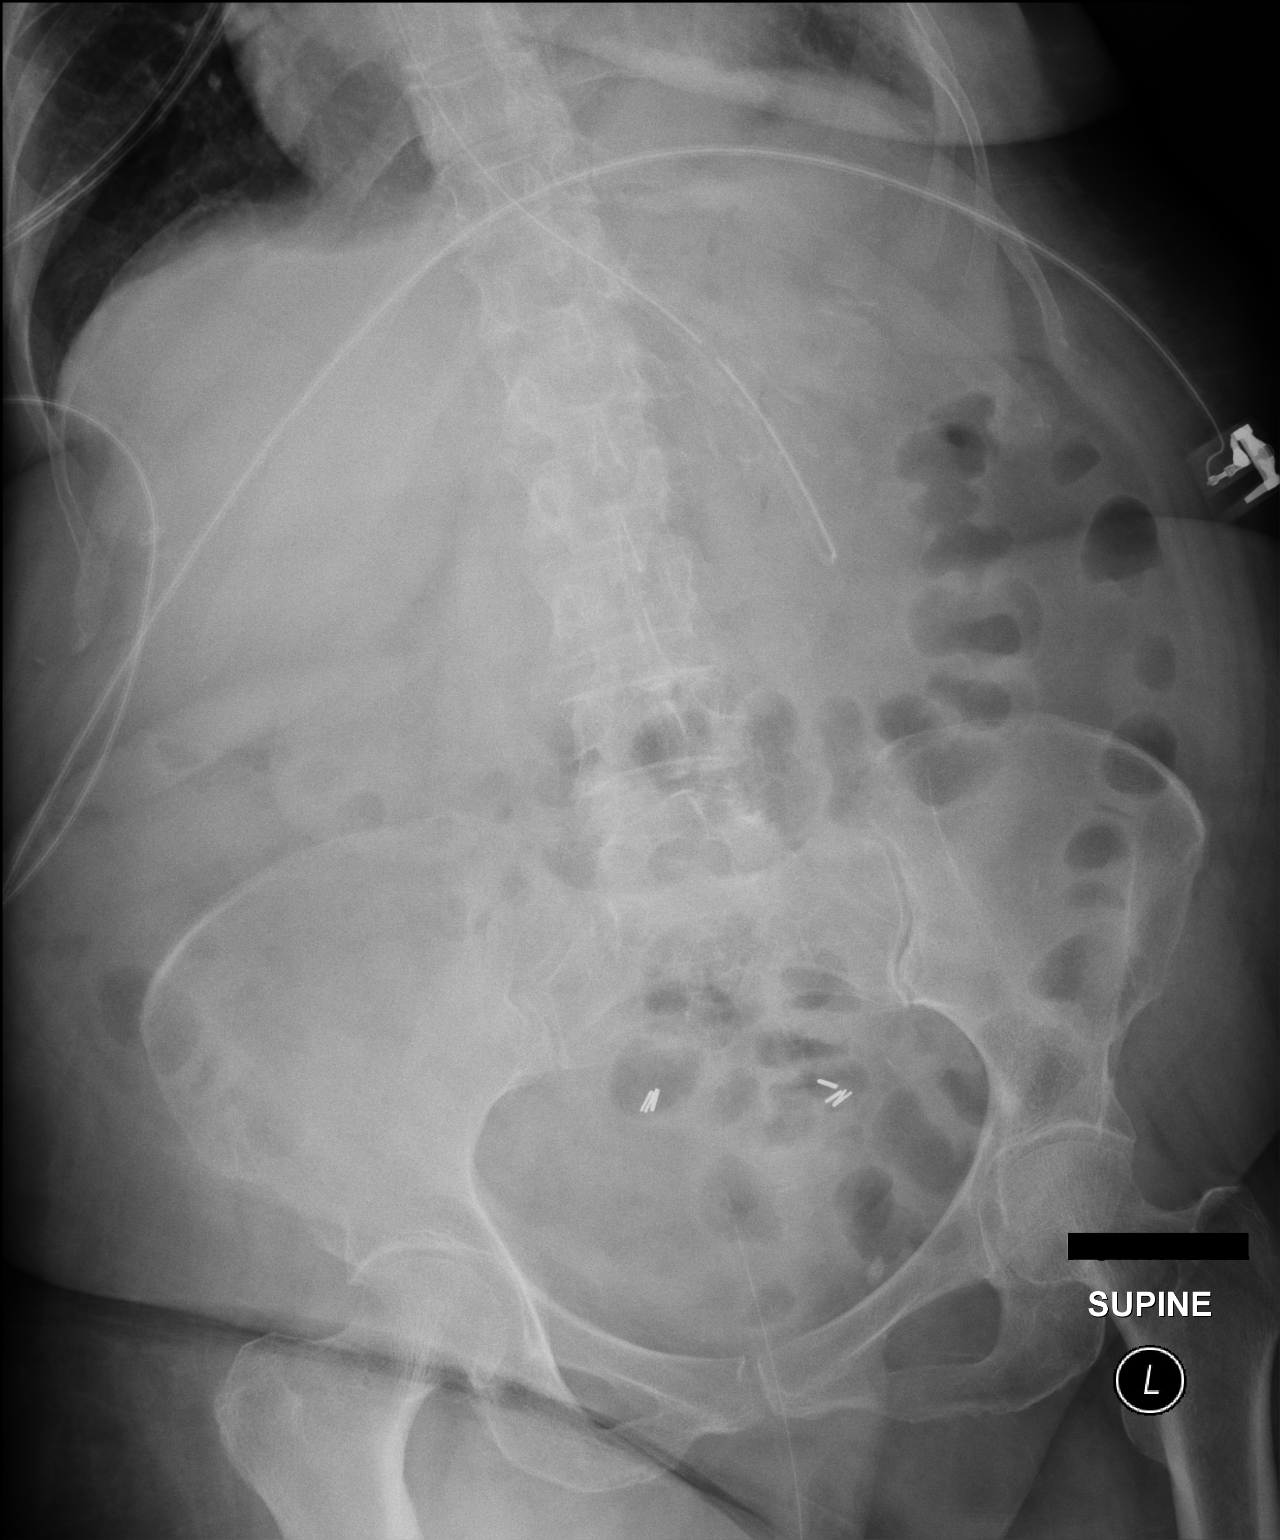

[1 of 1 positions shown; findings below may reference images not displayed]

FINDINGS: There is no bowel dilation. There is no radiographic evidence of a
bowel obstruction. A nasogastric tube lies within the mid stomach.

There are surgical vascular clips in the pelvis. Soft tissues are
otherwise unremarkable.
IMPRESSION: No evidence of a bowel obstruction.

## 2015-11-09 IMAGING — CR DG CHEST 1V PORT
1 series · 1 of 1 positions shown · non-contrast
Comparison: DG CHEST 1V PORT dated 06/18/2013

CLINICAL DATA: Respiratory failure.

EXAM:
PORTABLE CHEST - 1 VIEW

[AP]
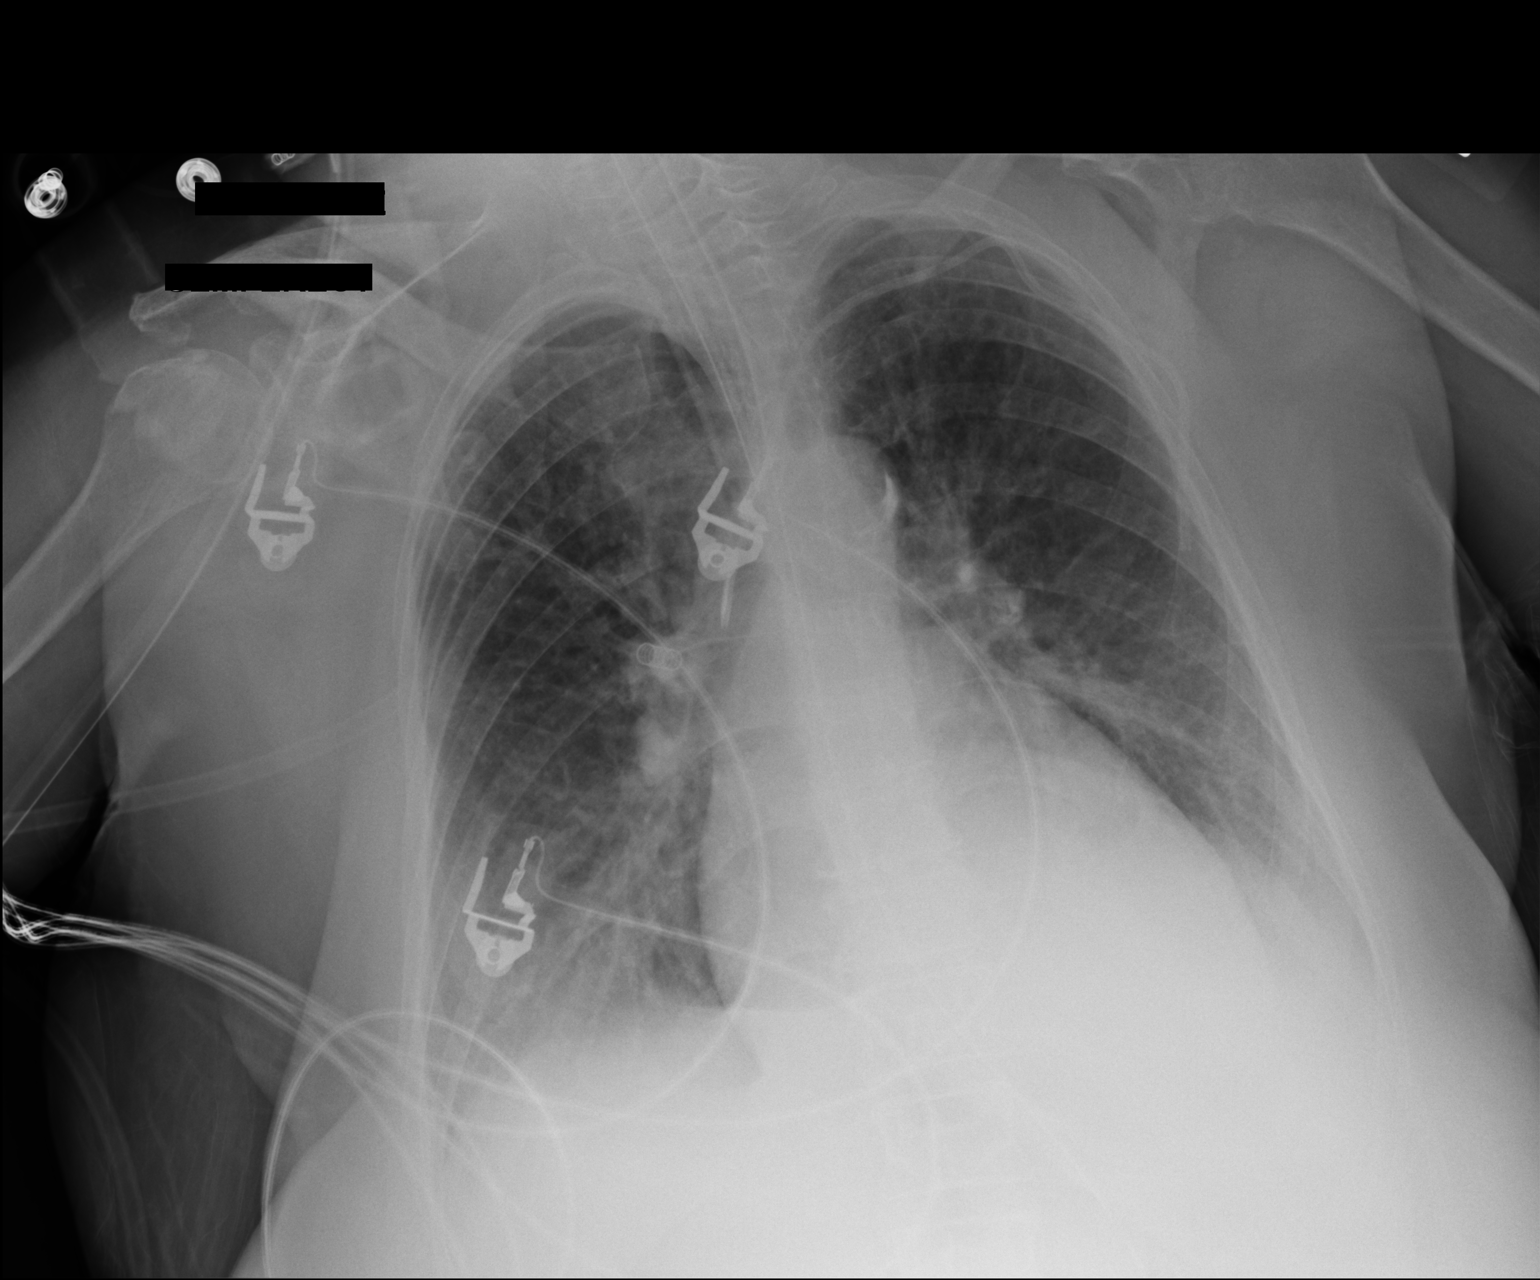

[1 of 1 positions shown; findings below may reference images not displayed]

FINDINGS: Endotracheal tube is 2.7 cm above the carina. Nasogastric tube
extends into the abdomen. Central line tip in the upper SVC region.
Hazy densities at both lung bases suggest atelectasis and pleural
effusions. Cannot exclude mild interstitial edema. Heart size is
within normal limits. Atherosclerotic calcifications at the aortic
arch.
IMPRESSION: Bibasilar densities are compatible with pleural effusions and
atelectasis. Probable mild interstitial edema.

## 2015-11-15 IMAGING — CR DG CHEST 1V PORT
1 series · 1 of 1 positions shown · non-contrast
Comparison: DG CHEST 1V PORT dated 06/24/2013

CLINICAL DATA: Respiratory failure.

EXAM:
PORTABLE CHEST - 1 VIEW

[AP]
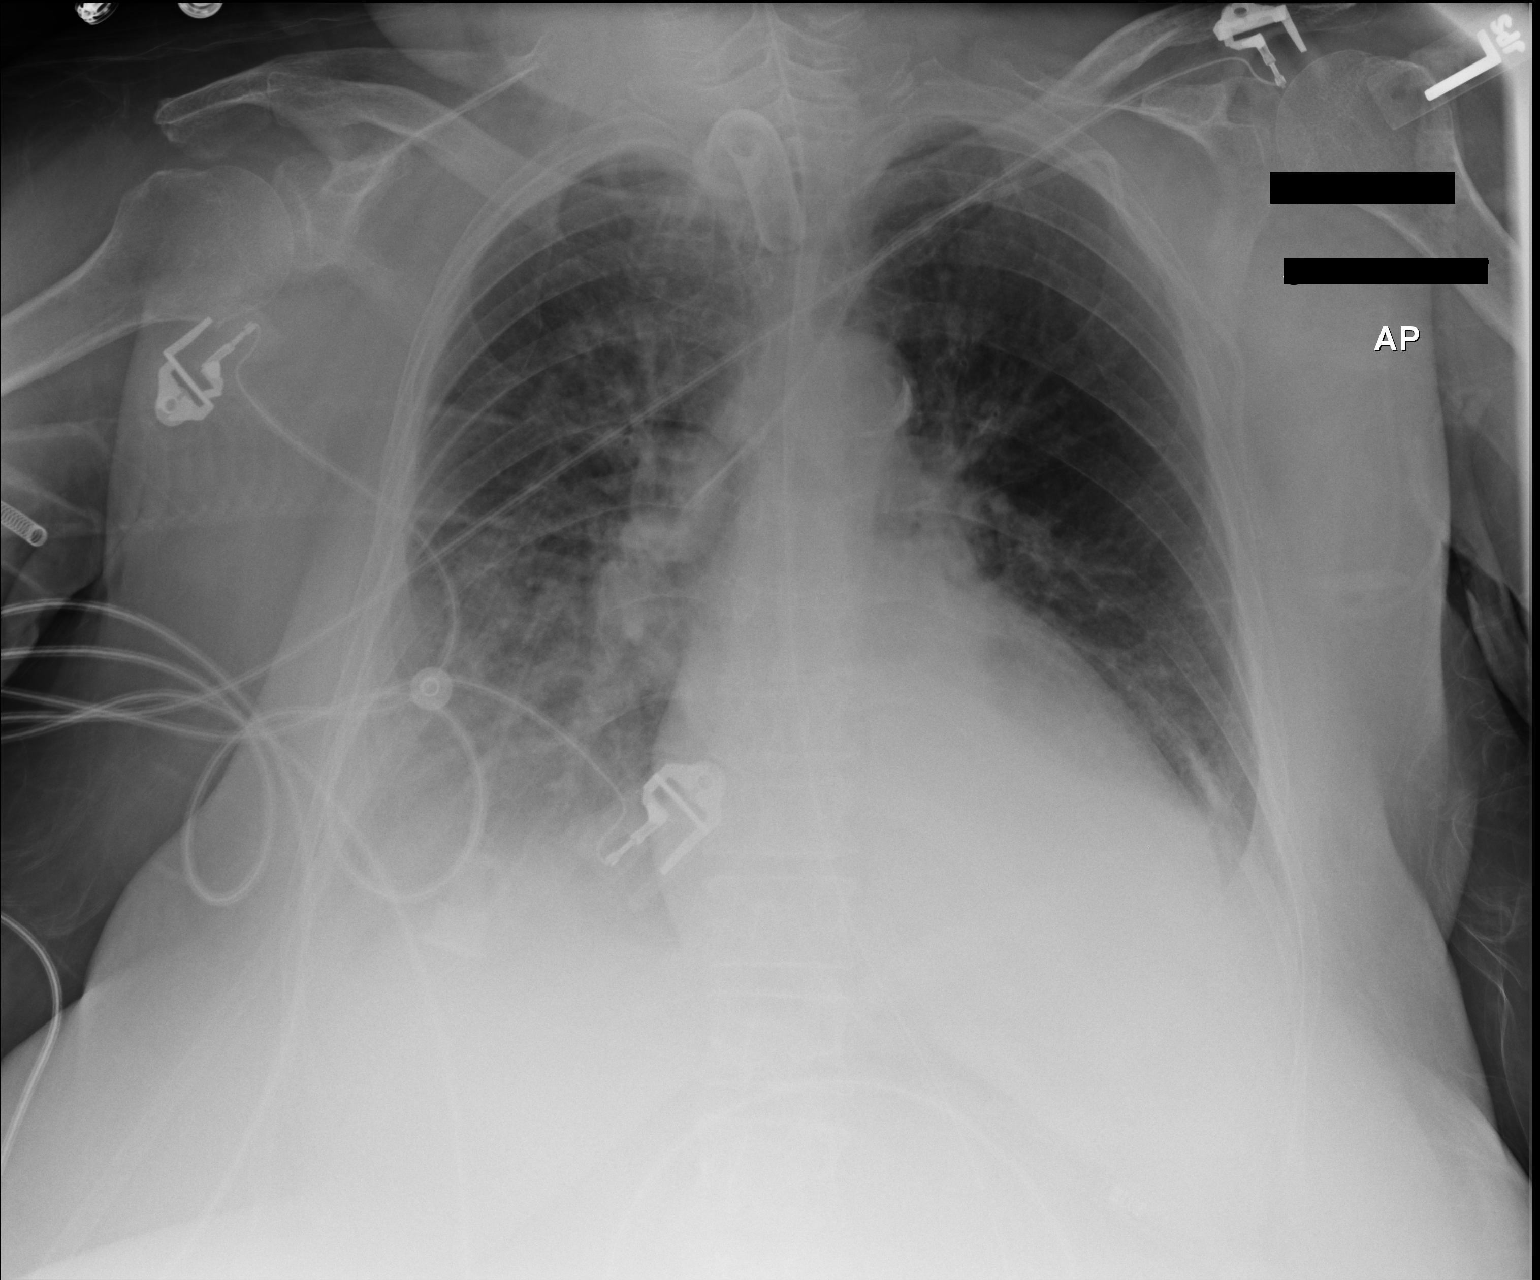

[1 of 1 positions shown; findings below may reference images not displayed]

FINDINGS: ET tube has been replaced with a tracheostomy which is in good
position. The heart is enlarged. There is mild vascular congestion
with bilateral effusions. Overall aeration is stable.
IMPRESSION: Tracheostomy tube good position.  Stable aeration.
